# Patient Record
Sex: Female | Born: 1955 | Race: White | Hispanic: No | Smoking: Current every day smoker
Health system: Southern US, Community
[De-identification: ages and names within clinical notes are randomized; demographics above are authoritative.]

## PROBLEM LIST (undated history)

## (undated) DIAGNOSIS — I10 Essential (primary) hypertension: Secondary | ICD-10-CM

## (undated) DIAGNOSIS — J40 Bronchitis, not specified as acute or chronic: Secondary | ICD-10-CM

## (undated) DIAGNOSIS — E785 Hyperlipidemia, unspecified: Secondary | ICD-10-CM

## (undated) DIAGNOSIS — N3281 Overactive bladder: Secondary | ICD-10-CM

## (undated) DIAGNOSIS — K219 Gastro-esophageal reflux disease without esophagitis: Secondary | ICD-10-CM

## (undated) DIAGNOSIS — M069 Rheumatoid arthritis, unspecified: Secondary | ICD-10-CM

## (undated) DIAGNOSIS — K5090 Crohn's disease, unspecified, without complications: Secondary | ICD-10-CM

## (undated) DIAGNOSIS — F419 Anxiety disorder, unspecified: Secondary | ICD-10-CM

## (undated) HISTORY — PX: APPENDECTOMY: SHX54

## (undated) HISTORY — PX: VAGINAL HYSTERECTOMY: SUR661

## (undated) HISTORY — PX: CHOLECYSTECTOMY: SHX55

---

## 2001-11-21 ENCOUNTER — Inpatient Hospital Stay (HOSPITAL_COMMUNITY): Admission: AD | Admit: 2001-11-21 | Discharge: 2001-11-29 | Payer: Self-pay | Admitting: Gastroenterology

## 2001-11-21 ENCOUNTER — Encounter: Payer: Self-pay | Admitting: Gastroenterology

## 2001-11-22 ENCOUNTER — Encounter: Payer: Self-pay | Admitting: Gastroenterology

## 2001-11-26 ENCOUNTER — Encounter: Payer: Self-pay | Admitting: Gastroenterology

## 2001-12-08 ENCOUNTER — Inpatient Hospital Stay (HOSPITAL_COMMUNITY): Admission: EM | Admit: 2001-12-08 | Discharge: 2001-12-23 | Payer: Self-pay

## 2001-12-08 ENCOUNTER — Encounter: Payer: Self-pay | Admitting: Gastroenterology

## 2001-12-08 ENCOUNTER — Ambulatory Visit (HOSPITAL_COMMUNITY): Admission: RE | Admit: 2001-12-08 | Discharge: 2001-12-08 | Payer: Self-pay | Admitting: Gastroenterology

## 2001-12-09 ENCOUNTER — Encounter (INDEPENDENT_AMBULATORY_CARE_PROVIDER_SITE_OTHER): Payer: Self-pay | Admitting: *Deleted

## 2001-12-09 HISTORY — PX: OTHER SURGICAL HISTORY: SHX169

## 2001-12-16 ENCOUNTER — Encounter: Payer: Self-pay | Admitting: General Surgery

## 2002-03-06 ENCOUNTER — Inpatient Hospital Stay (HOSPITAL_COMMUNITY): Admission: AD | Admit: 2002-03-06 | Discharge: 2002-03-08 | Payer: Self-pay | Admitting: Gastroenterology

## 2002-03-06 ENCOUNTER — Encounter: Payer: Self-pay | Admitting: Gastroenterology

## 2002-03-22 ENCOUNTER — Ambulatory Visit (HOSPITAL_COMMUNITY): Admission: RE | Admit: 2002-03-22 | Discharge: 2002-03-22 | Payer: Self-pay | Admitting: Gastroenterology

## 2002-04-21 ENCOUNTER — Ambulatory Visit (HOSPITAL_COMMUNITY): Admission: RE | Admit: 2002-04-21 | Discharge: 2002-04-21 | Payer: Self-pay | Admitting: Gastroenterology

## 2002-06-21 ENCOUNTER — Encounter (HOSPITAL_COMMUNITY): Admission: RE | Admit: 2002-06-21 | Discharge: 2002-09-19 | Payer: Self-pay | Admitting: Gastroenterology

## 2002-10-11 ENCOUNTER — Encounter (HOSPITAL_COMMUNITY): Admission: RE | Admit: 2002-10-11 | Discharge: 2003-01-09 | Payer: Self-pay | Admitting: Gastroenterology

## 2003-01-17 ENCOUNTER — Encounter (HOSPITAL_COMMUNITY): Admission: RE | Admit: 2003-01-17 | Discharge: 2003-04-17 | Payer: Self-pay | Admitting: *Deleted

## 2003-04-27 ENCOUNTER — Encounter (HOSPITAL_COMMUNITY): Admission: RE | Admit: 2003-04-27 | Discharge: 2003-07-26 | Payer: Self-pay | Admitting: Gastroenterology

## 2003-09-12 ENCOUNTER — Encounter (HOSPITAL_COMMUNITY): Admission: RE | Admit: 2003-09-12 | Discharge: 2003-12-11 | Payer: Self-pay | Admitting: Gastroenterology

## 2004-01-21 ENCOUNTER — Encounter (HOSPITAL_COMMUNITY): Admission: RE | Admit: 2004-01-21 | Discharge: 2004-04-20 | Payer: Self-pay | Admitting: *Deleted

## 2004-05-28 ENCOUNTER — Encounter (HOSPITAL_COMMUNITY): Admission: RE | Admit: 2004-05-28 | Discharge: 2004-08-26 | Payer: Self-pay | Admitting: Gastroenterology

## 2004-08-27 ENCOUNTER — Encounter (HOSPITAL_COMMUNITY): Admission: RE | Admit: 2004-08-27 | Discharge: 2004-11-25 | Payer: Self-pay | Admitting: Gastroenterology

## 2004-12-30 ENCOUNTER — Encounter (HOSPITAL_COMMUNITY): Admission: RE | Admit: 2004-12-30 | Discharge: 2005-03-30 | Payer: Self-pay | Admitting: Gastroenterology

## 2005-05-13 ENCOUNTER — Encounter (HOSPITAL_COMMUNITY): Admission: RE | Admit: 2005-05-13 | Discharge: 2005-08-11 | Payer: Self-pay | Admitting: Gastroenterology

## 2005-09-21 ENCOUNTER — Encounter (HOSPITAL_COMMUNITY): Admission: RE | Admit: 2005-09-21 | Discharge: 2005-12-20 | Payer: Self-pay | Admitting: Gastroenterology

## 2006-01-25 ENCOUNTER — Encounter (HOSPITAL_COMMUNITY): Admission: RE | Admit: 2006-01-25 | Discharge: 2006-04-25 | Payer: Self-pay | Admitting: Gastroenterology

## 2006-06-02 ENCOUNTER — Encounter (HOSPITAL_COMMUNITY): Admission: RE | Admit: 2006-06-02 | Discharge: 2006-08-31 | Payer: Self-pay | Admitting: Gastroenterology

## 2006-10-04 ENCOUNTER — Encounter (HOSPITAL_COMMUNITY): Admission: RE | Admit: 2006-10-04 | Discharge: 2007-01-02 | Payer: Self-pay | Admitting: Gastroenterology

## 2007-02-07 ENCOUNTER — Encounter (HOSPITAL_COMMUNITY): Admission: RE | Admit: 2007-02-07 | Discharge: 2007-05-08 | Payer: Self-pay | Admitting: Gastroenterology

## 2007-05-17 ENCOUNTER — Encounter (HOSPITAL_COMMUNITY): Admission: RE | Admit: 2007-05-17 | Discharge: 2007-08-15 | Payer: Self-pay | Admitting: Gastroenterology

## 2007-09-05 ENCOUNTER — Encounter (HOSPITAL_COMMUNITY): Admission: RE | Admit: 2007-09-05 | Discharge: 2007-12-04 | Payer: Self-pay | Admitting: Gastroenterology

## 2007-12-12 ENCOUNTER — Encounter (HOSPITAL_COMMUNITY): Admission: RE | Admit: 2007-12-12 | Discharge: 2008-01-05 | Payer: Self-pay | Admitting: Gastroenterology

## 2008-02-06 ENCOUNTER — Encounter (HOSPITAL_COMMUNITY): Admission: RE | Admit: 2008-02-06 | Discharge: 2008-05-06 | Payer: Self-pay | Admitting: *Deleted

## 2008-05-29 ENCOUNTER — Encounter (HOSPITAL_COMMUNITY): Admission: RE | Admit: 2008-05-29 | Discharge: 2008-08-27 | Payer: Self-pay | Admitting: *Deleted

## 2008-06-29 ENCOUNTER — Encounter (INDEPENDENT_AMBULATORY_CARE_PROVIDER_SITE_OTHER): Payer: Self-pay | Admitting: General Surgery

## 2008-06-29 ENCOUNTER — Ambulatory Visit (HOSPITAL_BASED_OUTPATIENT_CLINIC_OR_DEPARTMENT_OTHER): Admission: RE | Admit: 2008-06-29 | Discharge: 2008-06-29 | Payer: Self-pay | Admitting: General Surgery

## 2008-09-14 ENCOUNTER — Encounter (HOSPITAL_COMMUNITY): Admission: RE | Admit: 2008-09-14 | Discharge: 2008-12-13 | Payer: Self-pay | Admitting: Gastroenterology

## 2008-11-12 ENCOUNTER — Encounter (HOSPITAL_COMMUNITY): Admission: RE | Admit: 2008-11-12 | Discharge: 2008-11-13 | Payer: Self-pay | Admitting: Gastroenterology

## 2009-01-08 ENCOUNTER — Encounter (HOSPITAL_COMMUNITY): Admission: RE | Admit: 2009-01-08 | Discharge: 2009-04-08 | Payer: Self-pay | Admitting: Gastroenterology

## 2009-04-26 ENCOUNTER — Encounter (HOSPITAL_COMMUNITY): Admission: RE | Admit: 2009-04-26 | Discharge: 2009-07-25 | Payer: Self-pay | Admitting: Gastroenterology

## 2009-08-23 ENCOUNTER — Encounter (HOSPITAL_COMMUNITY): Admission: RE | Admit: 2009-08-23 | Discharge: 2009-11-21 | Payer: Self-pay | Admitting: Gastroenterology

## 2009-12-04 ENCOUNTER — Encounter (HOSPITAL_COMMUNITY)
Admission: RE | Admit: 2009-12-04 | Discharge: 2010-02-04 | Payer: Self-pay | Source: Home / Self Care | Attending: Gastroenterology | Admitting: Gastroenterology

## 2010-03-27 ENCOUNTER — Encounter (HOSPITAL_COMMUNITY): Payer: Medicare Other | Attending: Gastroenterology

## 2010-03-27 DIAGNOSIS — K5090 Crohn's disease, unspecified, without complications: Secondary | ICD-10-CM | POA: Insufficient documentation

## 2010-04-14 LAB — POCT HEMOGLOBIN-HEMACUE: Hemoglobin: 15.2 g/dL — ABNORMAL HIGH (ref 12.0–15.0)

## 2010-04-24 ENCOUNTER — Encounter (HOSPITAL_COMMUNITY): Payer: Self-pay

## 2010-05-20 NOTE — Op Note (Signed)
NAMEJARIAH, TARKOWSKI             ACCOUNT NO.:  [id number]   MEDICAL RECORD NO.:  [id number]          PATIENT TYPE:  AMB   LOCATION:  DSC                          FACILITY:  [facility]   PHYSICIAN:  Gabrielle Dare. Janee Morn, M.D.DATE OF BIRTH:  [birth year]   DATE OF PROCEDURE:  06/29/2008  DATE OF DISCHARGE:                               OPERATIVE REPORT   PREOPERATIVE DIAGNOSIS:  Ulcerated abdominal wall scar.   POSTOPERATIVE DIAGNOSIS:  Ulcerated abdominal wall scar.   PROCEDURE:  Excision of ulcerated scar on the abdominal wall.   SURGEON:  Gabrielle Dare. Janee Morn, MD   ANESTHESIA:  General with laryngeal mask airway   HISTORY OF PRESENT ILLNESS:  Ms. Miyazaki is a 55 year old white female  who is well-known to me status post ileocolectomy with drainage of intra-  abdominal abscess for Crohn disease in 2003.  She has developed an ulcer  of the central portion of her wound.  This did not respond to local  treatment over a course of 2 weeks, so she now presents for excision of  this area.  We are going to excise the entirety of her scar including  the ulcerated region.   PROCEDURE IN DETAIL:  Informed consent was obtained and the patient was  identified in the preop holding area.  She received intravenous  antibiotics.  She was brought to the operating room.  General anesthesia  with laryngeal mask airway was administered by the anesthesia staff.  Her abdomen was prepped and draped in sterile fashion.  Time-out  procedure was done.  Next, 0.25% Marcaine with epinephrine was injected  along her scar for postoperative pain relief.  An elliptical incision  was made to encompass the entirety of her scar including the central  ulcerated portion.  Subcutaneous tissues were dissected down and the  scar was excised in its entirety.  The area beneath the ulceration did  not contain any retained stitch or any deep inflammatory tissue.  The  entirety of the scar was removed.  Further exploration of the  wound  revealed no other evidence of ongoing infection or stitch abscess.  No  hernias were felt.  The scar was marked for orientation and sent to  pathology.  The wound was copiously irrigated.  Meticulous hemostasis  was ensured.  Subcutaneous tissues were approximated with interrupted 3-  0 Vicryl sutures and the skin was closed with running 4-0 Monocryl  subcuticular stitch followed by Dermabond.  Sponge, needle, and  instrument counts were correct.  The patient tolerated the procedure  well without apparent complication and was taken to recovery room in  stable condition.      Gabrielle Dare Janee Morn, M.D.  Electronically Signed     BET/MEDQ  D:  06/29/2008  T:  06/29/2008  Job:  [id number]   cc:   Fayrene Fearing L. Malon Kindle., M.D.

## 2010-05-22 ENCOUNTER — Encounter (HOSPITAL_COMMUNITY): Payer: Medicare Other | Attending: Gastroenterology

## 2010-05-22 DIAGNOSIS — K5090 Crohn's disease, unspecified, without complications: Secondary | ICD-10-CM | POA: Insufficient documentation

## 2010-05-23 NOTE — Discharge Summary (Signed)
Jessica [name], Jessica [name]                         ACCOUNT NO.:  [id number]   MEDICAL RECORD NO.:  [id number]                   PATIENT TYPE:  INP   LOCATION:  5503                                 FACILITY:  [facility]   PHYSICIAN:  James L. Malon Kindle., M.D.          DATE OF BIRTH:  [birth year]   DATE OF ADMISSION:  12/08/2001  DATE OF DISCHARGE:  12/23/2001                                 DISCHARGE SUMMARY   ADMISSION DIAGNOSIS:  Increasing abdominal pain in patient with known  Crohn's disease.   FINAL DIAGNOSES:  1. Abdominal abscess.  2. Crohn's disease.  3. Status post cholecystectomy, appendectomy, and vaginal hysterectomy.   PROCEDURES:  Percutaneous drainage of abdominal abscess.   CONSULTATIONS:  Dr. Violeta Gelinas of general surgery.   HISTORY OF PRESENT ILLNESS:  The patient is a nice 55 year old woman with  known Crohn's disease.  She had three weeks of fever, chills, right lower  quadrant pain.  Colonoscopy done earlier this year showed active Crohn's  disease of the terminal ileum, but essentially normal colon.  She has had a  previous appendectomy.  She has had increasing abdominal pain, fever,  chills, was seen in the office with this history.   PHYSICAL EXAMINATION:  VITAL SIGNS:  Lack of fever.  HEART:  Normal.  LUNGS:  Normal.  ABDOMEN:  Distended and soft with good bowel sounds.  Marked tenderness of  the right lower quadrant.  No guarding, rigidity, or rebound.  Stool was  brown, heme negative.   For more details, please see the dictated admission history and physical.   HOSPITAL COURSE:  The patient was admitted to the medical floor.  Had a CT  scan obtained which revealed an abscess of the terminal ileum persistent  with Crohn's.  The patient was started on IV antibiotics, was given Pentasa.  Underwent a percutaneous drain placement with  ________ withdrawal.  Labs  were otherwise unremarkable, other then some leukocyte esterase in the  urine.  Cultures of  the abscess revealed microaerophilic Streptococci, as  well as multiple species consistent with an enteric abscess.  The patient  was seen in consultation by Dr. Violeta Gelinas.  The patient was doing well  following the percutaneous drainage.  Diet was started back.  She had some  gas pain, but gradually her symptoms improved.  She was able to tolerated a  low residue diet.  We went ahead and added Entocort EC.  She has been  intolerant in the past to prednisone, we resumed her Pentasa.  After  discussion with Dr. Janee Morn, we felt that we could send her home with the  percutaneous drain.  Make arrangements for her to come in and have a repeat  scan which will be done the week following her discharge.   DISPOSITION:  The patient is discharged home with plans to come back in for  an outpatient scan.  She has received instructions and care  for her  percutaneous drain.   DISCHARGE MEDICATIONS:  1. Pentasa 250 mg four tablets q.i.d.  2. Entocort EC 3 mg three tablets q.d.  3. Paxil 20 mg q.h.s.  4. Xanax 0.5 mg p.r.n. t.i.d.  5. Protonix 40 mg q.a.m.  6. Bextra one tablet q.d.  7. Cipro 500 mg b.i.d.  8. Flagyl 500 mg t.i.d.  9. Vicodin or Percocet for pain.   DIET:  Remain on a low residue diet.   We will make further determinations after the outpatient CT scan.                                                James L. Malon Kindle., M.D.    Waldron Session  D:  12/22/2001  T:  12/24/2001  Job:  [id number]   cc:   Gabrielle Dare. Janee Morn, M.D.  [facility]  [address] [place], [place] [address]  Fax: [phone]   Marjory Lies, M.D.  [address]  [place]  [place] [address]  Fax: [phone]

## 2010-05-23 NOTE — Discharge Summary (Signed)
   NAMEMARSHELLE, BILGER                         ACCOUNT NO.:  [id number]   MEDICAL RECORD NO.:  [id number]                   PATIENT TYPE:  INP   LOCATION:  5710                                 FACILITY:  [facility]   PHYSICIAN:  James L. Malon Kindle., M.D.          DATE OF BIRTH:  24-Nov-1955   DATE OF ADMISSION:  03/06/2002  DATE OF DISCHARGE:  03/08/2002                                 DISCHARGE SUMMARY   ADMISSION DIAGNOSIS:  Phlegmonous Crohn's disease.   FINAL DIAGNOSIS:  Phlegmonous Crohn's disease.   BRIEF HISTORY:  The patient is a 55 year old with known Crohn's, had recent  hospitalization with intra-abdominal abscess, had been on Asacol and Medrol,  having increased right lower quadrant pain.  Outpatient CT showed  phlegmonous changes over the terminal ileum.  There was some question of air  or possibly early abscess.  Due to concern over this, he was placed in  hospital.  For details, please see dictated admission History and Physical.   HOSPITAL COURSE:  The patient was admitted to the medical floor and after  discussion with Dr. Janee Morn, would treat Crohn's aggressively, so we gave  her another dose of Remicade, continued her on Imuran.  She felt much better  within 24 hours of the first dose of Remicade.  Her abdomen was soft, less  tender, and she was clearly much improved.  She was treated empirically with  Cipro and Flagyl.   At this point in time, she is doing well on Entocort, Remicade, Imuran,  Cipro, and Flagyl.  It was felt that she could be safely discharged home to  follow up as an outpatient.   DISPOSITION:  The patient is discharged home.   DISCHARGE MEDICATIONS:  1. Asacol 2 tablet t.i.d.  2. Xanax 0.5 mg t.i.d.  3. Protonix 40 mg daily.  4. Paxil 1 tablet daily.  5. Imuran 50 mg daily.  6. Entocort 3 mg 3 tablets daily.  7. Flagyl 250 mg t.i.d.   DIET:  Low residue.    FOLLOW UP:  Will call on discharge to set up second Remicade treatment.  See  Dr. Randa Evens back in the office in 4 to 6 weeks.                                               James L. Malon Kindle., M.D.    Waldron Session  D:  04/08/2002  T:  04/10/2002  Job:  [id number]   cc:   Marjory Lies, M.D.  [address]  [place]  [place] [address]  Fax: [phone]   Gabrielle Dare. Janee Morn, M.D.  [facility]  [address] [place], [place] [address]  Fax: [phone]

## 2010-05-23 NOTE — H&P (Signed)
NAMEBRITANNY, [name]                         ACCOUNT NO.:  [id number]   MEDICAL RECORD NO.:  [id number]                   PATIENT TYPE:  INP   LOCATION:  5710                                 FACILITY:  [facility]   PHYSICIAN:  Jessica [name]., M.D.          DATE OF BIRTH:  [birth year]   DATE OF ADMISSION:  03/06/2002  DATE OF DISCHARGE:                                HISTORY & PHYSICAL   REASON FOR ADMISSION:  Worsened Crohn's disease with possible abscess.   HISTORY OF PRESENT ILLNESS:  A 55 year old woman who has known Crohn's  disease.  She had a recent hospitalization with an intra-abdominal abscess  and underwent an ileocolectomy by Jessica [name]. Jessica [name], M.D.  She was initially  treated with a CT guided percutaneous drainage, but ultimately required  surgical drainage.  She has done well since then.  Has been on Asacol,  Medrol, etc.  We saw her in the office and she was having some increased  right lower quadrant pain and arranged for an outpatient CT which was done  today showing phlegmonous change around the terminal ileum with some air,  possible early abscess.  There is possible extraluminal gas, but this is  very early.  She is admitted for bowel rest, IV antibiotics and further  treatment.   CURRENT MEDICATIONS:  Asacol 800 mg t.i.d., Medrol 2 mg q a.m., paroxetine  20 mg q.h.s., Xanax 0.5 t.i.d. and Protonix 40 daily.   ALLERGIES:  Penicillin and Codeine, prednisone and sulfa.   PAST MEDICAL HISTORY:  1. Crohn's disease, status post recent ileocolectomy with drainage of     abscess.  The patient has never been on antimetabolites or Rheumatex.  2. History of bronchitis and asthma.  3. Previous surgeries include cholecystectomy, appendectomy, vaginal     hysterectomy.   FAMILY HISTORY:  Mother has had heart disease and she had no cancer in the  family.  Negative for inflammatory bowel disease.   SOCIAL HISTORY:  She is married, has four children.  She is a heavy  smoker.  Denies drinking.   REVIEW OF SYSTEMS:  Remarkable primarily for the worsening of abdominal pain  but lack of diarrhea.   PHYSICAL EXAMINATION:  VITAL SIGNS:  Temperature 97.9, pulse 67, blood  pressure 116/65.  GENERAL:  Obese white female in no acute distress.  HEENT:  Eyes:  Clear, nonicteric.  Extraocular movements intact.  Throat is  normal.  NECK:  Supple, adenopathy.  LUNGS:  Clear.  HEART:  Regular rate and rhythm without murmurs or gallops.  ABDOMEN:  Soft, nondistended with very mild right lower quadrant tenderness.   ASSESSMENT:  Recurrent phlegmonous Crohn's disease with the anastomosis from  her previous surgery with possible early abscess formation.  We have  reviewed these films with Jessica [name]. Jessica [name], M.D. and Jessica [name], M.D.  and it is not clear if this is early abscess or simply phlegmonous Crohn's.  PLAN:  Will treat aggressively with bowel rest with clear liquids only.  Will continue the Asacol and go ahead and give her a dose of Remicade.  Will  start her empirically on Imuran and will start Entocort.  If things improve  in the next several days, we could send her home.  Will give her empiric  antibiotics and hope that we can avoid surgery.                                                Jessica [name]., M.D.    Jessica [name]  D:  03/06/2002  T:  03/06/2002  Job:  [id number]   cc:   Jessica [name], M.D.  [address]  [place]  [place] [address]  Fax: [phone]   Jessica [name]. Jessica [name], M.D.  [facility]  [address] [place], [place] [address]  Fax: [phone]

## 2010-05-23 NOTE — Consult Note (Signed)
Jessica [name], Jessica [name]                         ACCOUNT NO.:  [id number]   MEDICAL RECORD NO.:  [id number]                   PATIENT TYPE:  INP   LOCATION:  1833                                 FACILITY:  [facility]   PHYSICIAN:  Gabrielle Dare. Janee Morn, M.D.             DATE OF BIRTH:  Nov 27, 1955   DATE OF CONSULTATION:  DATE OF DISCHARGE:                                   CONSULTATION   REASON FOR CONSULTATION:  Crohn's disease with abscess.   HISTORY OF PRESENT ILLNESS:  The patient is a 55 year old white female who  is known to me from a recent admission to Dr. Randa Evens service for an  exacerbation of her Crohn's disease with an abscess at her terminal ileum.  This had been percutaneously drained successfully and the drains were  removed.  She presented today to the radiology department for followup CT  scan of her abdomen which shows some recurrence of an abscess at the  terminal ileum.  There is still some inflammation of her terminal ileum  although it is a little bit less than before.  By history, she does complain  of some continuing pain down in her lower abdomen.  She has also been having  frequent diarrhea since her past discharge.  She denies any significant  hematochezia, nausea or vomiting.  She was seen by Dr. Randa Evens in the  radiology department and he planned to admit her to the hospital for  intravenous antibiotics and he spoke with me in regards to surgical  intervention.   ALLERGIES:  1. PENICILLIN.  2. CODEINE.  3. PREDNISONE.  4. SULFA.  5. DILAUDID.   PAST MEDICAL HISTORY:  Crohn's disease, bronchitis and asthma.   PAST SURGICAL HISTORY:  Cholecystectomy, appendectomy and vaginal  hysterectomy.   FAMILY HISTORY:  Mother has coronary artery disease.  Her father had neck  cancer.  Her grandmother had colon cancer and one aunt in the family has  inflammatory bowel disease.   SOCIAL HISTORY:  She smokes one pack a day.  She does not drink alcohol.   REVIEW OF  SYSTEMS:  GENERAL:  Negative.  CARDIOVASCULAR:  She has no chest  pain.  GI:  Please  refer to history of present illness.  MUSCULOSKELETAL:  She is complaining of some pain in her knees that she has been told is some  arthritis associated with her Crohn's disease.  Review of systems is  otherwise negative.   PHYSICAL EXAMINATION:  VITAL SIGNS:  Temperature 99.4, blood pressure  104/58, respirations 20, heart rate is 80.  GENERAL:  She is awake, alert in no acute distress. She is mildly upset.  NECK:  Supple.  LUNGS:  Clear to auscultation  bilaterally.  HEART:  Regular rate and rhythm.  ABDOMEN:  Nondistended. She has active bowel sounds.  She has some mild  tenderness in her infraumbilical area and suprapubic area with no guarding  or rebound.  EXTREMITIES:  Warm with good distal pulses.   Her CT scan was reviewed with radiologist and demonstrates a recurrent  abscess at her terminal ileum.  There is no extravasation of contrast from  the ileum into the abscess cavity and there is still some residual  inflammation of the terminal ileum.   ASSESSMENT:  Crohn's disease with recurrent intra-abdominal abscess at the  terminal ileum.  In light of the patient failing one attempt with  percutaneous drainage having complete resolution, I spoke with Dr. Randa Evens  and I agree with this recommendation for surgical intervention at this time.  I recommend going ahead with admitting the patient to the hospital as Dr.  Randa Evens has planned and giving her some IV antibiotics.  We will plan to do  a bowel prep tonight and go ahead tomorrow morning with surgery to include  right colectomy and resection of the distal ileum with drainage of intra-  abdominal abscess.  I discussed this at length with the patient and her  husband and including the risks and benefits she has agreed to proceed.                                               Gabrielle Dare Janee Morn, M.D.    BET/MEDQ  D:  12/08/2001  T:   12/08/2001  Job:  [id number]

## 2010-05-23 NOTE — Discharge Summary (Signed)
Jessica [name], Jessica [name]                         ACCOUNT NO.:  [id number]   MEDICAL RECORD NO.:  [id number]                   PATIENT TYPE:  INP   LOCATION:  5503                                 FACILITY:  [facility]   PHYSICIAN:  Gabrielle Dare. Janee Morn, M.D.             DATE OF BIRTH:  January 05, 1956   DATE OF ADMISSION:  12/08/2001  DATE OF DISCHARGE:  12/23/2001                                 DISCHARGE SUMMARY   DISCHARGE DIAGNOSIS:  Crohn's disease with intra-abdominal abscess, status  post ileocolectomy.   HISTORY OF PRESENT ILLNESS:  The patient is a 55 year old white female who  was known to me from a recent admission from Dr. Fayrene Fearing L. Edwards for  exacerbation of her Crohn's disease with an intra-abdominal abscess.  This  was initially treated with a CT-guided percutaneous drainage, but she  presented for follow-up CT of the abdomen as an outpatient which  demonstrated some recurrence of an abscess at her terminal ileum and she is  admitted to the hospital.   HOSPITAL COURSE:  After admission, plans were discussed with Dr. Fayrene Fearing L.  Edwards and we both agreed that the patient would need to proceed with  surgery in order to definitively treat this problem.  On December 09, 2001,  after undergoing bowel prep, she underwent resection of her terminal ileum  and portion of her right colon with evacuation of intra-abdominal abscess.  She tolerated the procedure well.  Postoperatively, she remained  hemodynamically stable and afebrile.  On December 11, 2001, she developed  some drainage from her wound.  Staples were removed and the wound was opened  and cultured.  She began on wet-to-dry dressings for several days but her  cultures grew out methicillin-resistant Staphylococcus positive.  She was  treated with vancomycin intravenously.   She had a JP drain placed in surgery which was removed as its drainage  tapered off.  She was maintained on Cipro and Flagyl intravenously to treat  her  intra-abdominal abscess.   Her wound, once it cleaned up a little bit, the wound care team was  consulted and they placed a VAC on it which continued to work very well.  Otherwise, the patient had somewhat of a prolonged ileus but her bowel  functionally gradually returned.  She tolerated slow advancement of a diet  from clear to a low residual Crohn's diet.  She continued to remain afebrile  and hemodynamically stable.  She completed 14 days of Cipro and Flagyl IV  and nine days of Vancomycin IV.  Her hemoglobin had been low during her  admission.  She was asymptomatic and not tachycardia and was 7.3 on the day  of discharge.  Considering the added risk of immunosuppression and the other  risks of transfusion, discussed with the patient and she agreed not to be  transfused.  This is appropriate as she is asymptomatic and does not have  significant cardiac history.  Otherwise, home health arranged for home VAC  dressing changes and these were set up.  She is discharged home in stable  condition.   DIET:  Low residual Crohn's diet.   ACTIVITY:  No lifting.   DISCHARGE MEDICATIONS:  1. Percocet 5/325 mg 1-2 q.6h. p.r.n. pain.  2. Asacol 400 mg 2 of these p.o. b.i.d.  3.     Decadron 4 mg 1 q.d.  4. Zyvox 400 mg p.o. b.i.d. for one week.   FOLLOW UP:  Follow-up will be in one week with Dr. Janee Morn and in one month  with Dr. Fayrene Fearing L. Edwards.                                               Gabrielle Dare Janee Morn, M.D.    BET/MEDQ  D:  12/23/2001  T:  12/25/2001  Job:  [id number]

## 2010-05-23 NOTE — H&P (Signed)
Jessica [name], Jessica [name]                          ACCOUNT NO.:  [id number]   MEDICAL RECORD NO.:  [id number]                   PATIENT TYPE:  INP   LOCATION:  5529                                 FACILITY:  [facility]   PHYSICIAN:  James L. Malon Kindle., M.D.          DATE OF BIRTH:  07-30-1955   DATE OF ADMISSION:  11/21/2001  DATE OF DISCHARGE:                                HISTORY & PHYSICAL   REASON FOR ADMISSION:  Abdominal pain in a woman with Crohn's disease.   HISTORY:  55 year old, white female with know Crohn's disease.  She has done  fairly well until recently, had a colonoscopy performed in April of 2003  showing Crohn's of the terminal ileum.  She had been very intolerant of  Prednisone, had to be Medrol because of severe psychological reactions to  Prednisone.  The last time that she was seen in the office was May of this  year, she was feeling quite well with the Asacol; we had her due to come  back in several months.  She comes in today with an acute working visit.  She has had three weeks of worsening abdominal pain in the right lower  quadrant, it has gotten progressively worse.  She is having some loose  stools, but mostly constipated with difficulty passing her bowel movements.  Some distention.  She has had some fever and chills, no dysuria, no nausea  and no vomiting.  At times, she has been too sick to take her medicine and  the pain has gotten progressively worse, it nearly had her doubled over.  She does indicate that she has had previous appendectomy at the age of 31.  Due to her worsening abdominal pain, she was seen urgently today.   CURRENT MEDICATIONS:  1. Paxil 20 mg q. h.s.  2. Xanax p.r.n.  3. Asacol 16 mg t.i.d.   ALLERGIES:  She is allergic to penicillin, codeine, Prednisone and sulfur.   MEDICAL HISTORY:  1. Crohn's disease with most recent colonoscopy showing no Crohn's of the     colon currently although she has had essentially a blockage of her  colon     from the sigmoid in the past due to Crohn's.  She did have active ileitis     in April fo this year on colonoscopy.  2. She does have a history of bronchitis and asthma but does not take     anything for this.   PREVIOUS SURGERIES:  Include a cholecystectomy in 1996, an appendectomy in  1977 and a vaginal hysterectomy in the early 1980s.  She has had four  children.   FAMILY HISTORY:  Mother had a CABG.  There was head and neck cancer in her  father.  Grandmother had colon cancer.  Alcoholism in the family.  Negative  for inflammatory bowel disease.   SOCIAL HISTORY:  She is married, has four children, does not smoke or drink.  REVIEW OF SYSTEMS:  Remarkable for progressive pain and lack of diarrhea or  bleeding.   PHYSICAL EXAMINATION:  VITAL SIGNS:  Temperature 97.9, pulse 64, blood  pressure 130/70, weight 212, down approximately 12-14 pounds from the  previous visit.  GENERAL:  The patient is tearful, but no severe distress.  EYES:  Clear, non-icteric.  Extraocular movements intact, sclerae not  icteric.  NECK:  Supple, no lymphadenopathy.  THROAT:  Normal.  Mucous membranes appear moist.  LUNGS:  Clear.  HEART:  Regular rate and rhythm without murmurs or gallops.  ABDOMEN:  Distended, generally soft with bowel sounds present with marked  tenderness in the right lower quadrant.  No guarding, rigidity or rebound.  I carefully examined the patient's abdomen and I did not see clearly an  appendectomy scar though she does give the history of having had an  appendectomy, this was in 1970.  She does have a lot of stretch marks in  that area and I presume one of these could actually represent an  appendectomy scar but it is not entirely clear cut.  RECTAL:  The stool is brown, formed and heme negative.   ASSESSMENT:  Right lower quadrant pain of unclear cause.  Due to fever and  chills, I think we need to presume that this is infectious, it is a bit  bothersome that I  cannot clearly see an appendectomy scar although she does  clearly give a history of having had an appendectomy.  This could be Crohn's  but she has no diarrhea or blood in the stool, it possibly is an abscess or  something else entirely.   PLAN:  E will give IV pain medicines for now, obtain routine labs, culture  her urine.  Obtain a CT of the abdomen to help [place] look for appendicitis,  Crohn's abscess, etc.                                               Fayrene Fearing L. Malon Kindle., M.D.    Waldron Session  D:  11/21/2001  T:  11/21/2001  Job:  [id number]

## 2010-05-23 NOTE — Consult Note (Signed)
[name], [name]                         ACCOUNT NO.:  [id number]   MEDICAL RECORD NO.:  [id number]                   PATIENT TYPE:  INP   LOCATION:  5529                                 FACILITY:  [facility]   PHYSICIAN:  Gabrielle Dare. Janee Morn, M.D.             DATE OF BIRTH:  01-24-1955   DATE OF CONSULTATION:  11/21/2001  DATE OF DISCHARGE:                                   CONSULTATION   REASON FOR CONSULTATION:  Abdominal abscess.   HISTORY OF PRESENT ILLNESS:  The patient is a 55 year old white female with  a history of Crohn's disease, who has been followed by Dr. Fayrene Fearing L. Edwards.  She had been feeling well until recently, when she has had three weeks of  increasing abdominal pain in the right lower quadrant.  It has gotten  progressively worse.  She has been having loose stools but no hematochezia.  She has had some abdominal distention with subjective fevers at home but she  has had no nausea or vomiting.  She was seen in Dr. Dahlia Client office and  admitted to the hospital today.   MEDICATIONS:  Medications at this time include Xanax, Benadryl, Cipro,  Flagyl and Percocet.   ALLERGIES:  Allergies include PENICILLIN, CODEINE, PREDNISONE, SULFA and  DILAUDID.   PAST MEDICAL HISTORY:  Crohn's disease and bronchitis and asthma.   PAST SURGICAL HISTORY:  Past surgical history includes cholecystectomy,  appendectomy, vaginal hysterectomy.   FAMILY HISTORY:  Mother has coronary artery disease.  Her father had some  head and neck cancer.  Her grandmother had colon cancer.  One aunt in the  family has inflammatory bowel disease.   SOCIAL HISTORY:  She smokes one pack a day and does not drink.   REVIEW OF SYSTEMS:  GENERAL:  Negative.  CARDIOVASCULAR:  No chest pain,  coronary artery disease or shortness of breath.  GI:  Please refer to the  HPI.  The review of systems is otherwise negative.   PHYSICAL EXAMINATION:  VITAL SIGNS:  Temperature is 99.1, blood pressure  124/64,  her pulse is 66 and respirations are 14.  GENERAL:  She is awake, alert and in no acute distress.  NECK:  Her neck is supple.  CHEST:  Her chest is clear to auscultation bilaterally.  HEART:  Her heart is regular rate and rhythm.  ABDOMEN:  Her abdomen is soft but in her mid-lower abdomen, she is tender to  palpation without rebound or guarding.  She does have bowel sounds.  EXTREMITIES:  Her extremities are warm with good distal pulses.   LABORATORY AND ACCESSORY CLINICAL DATA:  Her laboratories are sodium 137,  potassium 3.5, chloride 94, CO2 30, BUN 6, creatinine 0.8; glucose is 101.  Her white blood cell count is 12,300, hemoglobin 11.7, hematocrit 36.6,  platelets 521,000.  Alkaline phosphatase 331, AST 23, ALT 48.   Her CAT scan demonstrates a thickened terminal ileum with an  associated  large abscess right along side the terminal ileum.   ASSESSMENT:  Crohn's exacerbation with intra-abdominal abscess associated  with her terminal ileum.   RECOMMENDATIONS:  Recommendations include IV antibiotics, as she is already  on, and a CT-guided drainage of this abscess, which I arranged with  radiology to be done tomorrow morning.  I will follow her closely with you,  certainly for surgical intervention.  I believe she will improve with CT-  guided drainage and these IV antibiotics.  I discussed the plan with the  patient and her family and they are in agreement.   Thank you for this consult.                                               Gabrielle Dare Janee Morn, M.D.    BET/MEDQ  D:  11/21/2001  T:  11/22/2001  Job:  [id number]

## 2010-05-23 NOTE — Consult Note (Signed)
Jessica [name]                         ACCOUNT NO.:  [id number]   MEDICAL RECORD NO.:  [id number]                   PATIENT TYPE:  INP   LOCATION:  5710                                 FACILITY:  [facility]   PHYSICIAN:  Gabrielle Dare. Janee Morn, M.D.             DATE OF BIRTH:  15-Sep-1955   DATE OF CONSULTATION:  03/06/2002  DATE OF DISCHARGE:                                   CONSULTATION   REASON FOR CONSULTATION:  Crohn's exacerbation.   HISTORY OF PRESENT ILLNESS:  The patient is a 55 year old white female with  a history of Crohn's disease who is status post an ileocolectomy on December 09, 2001 for recurrent Crohn's abscess involving her terminal ileum.  She is  followed also by Dr. Fayrene Fearing L. Edwards.  She complains of  two-week history  of right lower quadrant abdominal pain, somewhat crampy and dull in nature.  She is an evaluated as an outpatient with a CAT scan today which  demonstrated a phlegmon around her new terminal ileum and anastomosis area  with some questionable extraluminal gas bubble.  There is no abscess or  fluid in the area.  She was admitted to Dr. Fayrene Fearing L. Edwards' service.   Currently, the patient still complains of some pain down her right lower  quadrant.  She is a little bit upset about having to come into the hospital  and she claims she is not feeling that badly.  She is eager to get her  treatment started so she can work towards going home.  She denies any other  current GI symptoms.  She is having some alternating constipation and  diarrhea but she says this is as she expects it normally with her  inflammatory bowel disease in conjunction with irritable bowel syndrome.   PAST MEDICAL HISTORY:  Significant for Crohn's disease, bronchitis, and  asthma.   PAST SURGICAL HISTORY:  Includes cholecystectomy, appendectomy, vaginal  hysterectomy, and ileocolectomy.   MEDICATIONS:  1. Asacol 800 mg p.o. t.i.d.  2. Eproxindine 20 mg p.o. q.h.s.  3. Xanax  0.5 mg p.o. t.i.d.  4. Protonix 40 mg p.o. q.d.  5. Cipro 400 mg IV q.12h.  6. Flagyl 500 mg IV q.8.  7. Paxil 20 mg p.o. q.d.   ALLERGIES:  PENICILLIN, CODEINE, PREDNISONE, and SULFA.   FAMILY HISTORY:  Noncontributory.   REVIEW OF SYMPTOMS:  GENERAL:  In general, she is been feeling somewhat  poorly over the past two weeks with this abdominal pain.  PULMONARY:  No  shortness of breath.  CARDIOVASCULAR:  No chest pain or other complaints.  GASTROINTESTINAL:  Please see the history of present illness.  MUSCULOSKELETAL:  She complains of some arthritis pain in her knees but  claims that her swelling is decreased.   PHYSICAL EXAMINATION:  VITAL SIGNS:  Temperature is 97.9, heart rate 67,  respirations 16, blood pressure 116/65.  GENERAL:  She is awake, alert  in no acute distress.  HEENT:  Pupils are equal and reactive.  NECK:  Supple without adenopathy.  LUNGS:  Clear to auscultation bilaterally.  HEART:  Regular rate and rhythm.  ABDOMEN:  Soft.  She has some tenderness in her right lower quadrant with no  guarding or peritoneal signs.  The rest of her abdomen is nontender.  No  masses are palpable.  Her midline wound has healed.  EXTREMITIES:  Warm with no peripheral edema.   LABORATORY DATA:  White blood cell count of 8.7, hemoglobin 11.8, hematocrit  36, platelets 341,000.  Sodium 136, potassium 3.9, chloride 100, CO2 30, BUN  5, creatinine 0.7, glucose 103.  AST is 15, ALT is 60, alkaline phosphate is  64 with a bilirubin of 0.2.   CT scan of the abdomen shows a phlegmon around her new terminal ileum in her  anastomosis area with some questionable evidence of extrarenal gas; however,  there is no abscess or free fluid in the area.   IMPRESSION:  Crohn's exacerbation of the phlegmon of the new terminal ileum.   RECOMMENDATIONS:  Bowel rest, intravenous antibiotics, some Crohn  medications as per Dr. Fayrene Fearing L. Edwards, and hopefully with medical therapy  we can get this  inflammation to subside.  The above was discussed with Dr.  Fayrene Fearing L. Edwards and I will follow her closely with you.                                               Gabrielle Dare Janee Morn, M.D.    BET/MEDQ  D:  03/06/2002  T:  03/06/2002  Job:  [id number]

## 2010-05-23 NOTE — Op Note (Signed)
NAMEREMEDY, CORPORAN                         ACCOUNT NO.:  [id number]   MEDICAL RECORD NO.:  [id number]                   PATIENT TYPE:  INP   LOCATION:  5503                                 FACILITY:  [facility]   PHYSICIAN:  Gabrielle Dare. Janee Morn, M.D.             DATE OF BIRTH:  1955-02-15   DATE OF PROCEDURE:  12/09/2001  DATE OF DISCHARGE:                                 OPERATIVE REPORT   PREOPERATIVE DIAGNOSIS:  Crohn's disease with abscess of terminal ileum.   POSTOPERATIVE DIAGNOSIS:  Crohn's disease with abscess of terminal ileum.   OPERATION PERFORMED:  1. Resection of terminal ileum.  2. Partial right colectomy with evacuation of intra-abdominal abscess.   SURGEON:  Gabrielle Dare. Janee Morn, M.D.   ASSISTANT:  Jimmye Norman, M.D.   ANESTHESIA:  General anesthesia.   HISTORY OF PRESENT ILLNESS:  The patient is a 55 year old female who had a  recent hospitalization for an exacerbation of her Crohn's disease with an  abscess near her terminal ileum.  She underwent percutaneous drainage and  improved clinically, and was discharged home, but on follow up CT yesterday  she was found to have a recurrent abscess in the area and residual  inflammation of her terminal ileum.  So she is brought to the operating room  for resection of the affected area and drainage of the intra-abdominal  abscess.   DESCRIPTION OF OPERATION:  The patient was brought to the operating room.  She had been receiving IV antibiotics and general anesthesia was  administered.  Her abdomen was prepped and draped in a sterile fashion.  A  midline incision was made and 15 mm were dissected down and the fascia was  divided, and the peritoneal cavity was entered without difficulty.  Exploration of the abdomen revealed a mass of very inflamed terminal ileum  down in the right lower quadrant and pelvis.  The remainder of the small  bowel seem unaffected.  The terminal ileum was dissected out of the pelvis  and the  abscess cavity was entered.  There was some purulent material there,  which was evacuated.  Subsequently several adhesions were taken down from  surrounding small bowel and the bladder from this area allowing mobilization  of the terminal ileum.  The mesentery was foreshortened by this inflammatory  process.  Once this was accomplished the small bowel was run from the  terminal ileum and back all the way up to the ligament of Treitz.  There was  a segment that was involved in this inflammatory process.  It was about 4-5  cm in length.  The rest of the small bowel was not inflamed and the right  colon was not inflamed either.  So, we proceeded with the resection.   The small bowel was divided with the GIA-55 stapler up to 10 cm proximal the  very bad inflamed area.  We needed to divided there as the mesentery  approaching the inflammation was very foreshortened, and to insure good  margins.  Subsequently the right colon was further mobilized from its  retroperitoneal attachments and then it was divided with a GIA-75 stapler  proximal to the hepatic flexure.  Once this was accomplished the mesentery  of the proximal right colon, cecum and the terminal ileum was subsequently  clamped, divided and then the area ligated or suture ligated in a meticulous  fashion as the mesentery was quite thickened and foreshortened.  Once this  was accomplished and specimen was removed good hemostasis was insured at the  point of removal from the mesentery.  Subsequently the abdomen was copiously  irrigated including the area of the abscess cavity.  I then proceeded to do  a side-to-side anastomosis of the ileum with the distal right colon with a  GIA-55 stapler.  The staple line was checked and noted to be hemostatic, and  the enterotomy was closed with a TA-60 stapler.  Prior to performing the  anastomosis a 2-0 silk suture was placed to secure the ileum to the colon at  the crouch of the anastomosis.   Subsequently the rent in the mesentery near  the anastomosis was closed with a series of interrupted 2-0 silk sutures.  Once this was accomplished the anastomosis was checked and there was no  leaks in the area as it was widely patent, and the colon and ileum there  were viable.  Subsequently the abdomen was again copiously irrigated.  We  changed our gloves.  A 19 French round Blake drain was placed down into the  abscess cavity deep down towards the pelvis and the sigmoid colon was placed  over this area to prevent any knuckles of small bowel from being trapped  down in there.  The NG tube was checked and noted to be in good position.  Consequently the abdomen was closed with a running #1 PDS suture.  The  subcutaneous tissues were copiously irrigated and the skin was closed with  staples.   Sponge and needle counts, and instrument count were all correct.   The patient tolerated the procedure well without obvious complications.  A  sterile dressing was applied and the patient was taken to the recovery room  in stable condition.                                                 Gabrielle Dare Janee Morn, M.D.    BET/MEDQ  D:  12/09/2001  T:  12/10/2001  Job:  [id number]

## 2010-05-23 NOTE — H&P (Signed)
Jessica [name], Jessica [name]                         ACCOUNT NO.:  [id number]   MEDICAL RECORD NO.:  [id number]                   PATIENT TYPE:  EMS   LOCATION:  MINO                                 FACILITY:  [facility]   PHYSICIAN:  James L. Malon Kindle., M.D.          DATE OF BIRTH:  September 21, 1955   DATE OF ADMISSION:  12/08/2001  DATE OF DISCHARGE:                                HISTORY & PHYSICAL   REASON FOR ADMISSION:  Recurrent abscess in a woman with Crohn's disease.   HISTORY OF PRESENT ILLNESS:  The patient is a 55 year old with known Crohn's  disease who has been doing  fairly well. In April of  2003 she had a  colonoscopy showing minimal inflammation of the terminal ileum. She had been  on Asacol since then and had done well. She came in early November after  three weeks of abdominal pain. A workup showed an intra-abdominal abscess.  It was drained percutaneously with marked improvement, and the patient was  discharged on Cipro and Flagyl. She was also discharged on Pentasa and  budesonide.   The patient came back in today for a repeat CT scan. She is having  increasing pain in her legs and difficulties with moving her legs and knees.  Some abdominal pain  but unfortunately a CT scan showed the abscess had  recurred with 4 to 5 cm of abscess and active Crohn's disease.   CURRENT MEDICATIONS:  1. Cipro 500 b.i.d.  2. Flagyl 500 b.i.d.  3. Xanax p.r.n.  4. Paxil 20 mg q.h.s.  5. Pentasa 4 tablets q.i.d.  6. Budesonide 9 mg q.d.   ALLERGIES:  1. PENICILLIN.  2. CODEINE.  3. PREDNISONE.  4. SULFA.   PAST MEDICAL HISTORY:  1. History of Crohn's disease with colonoscopy showing Crohn's at the     terminal ileum. She has had active Crohn's of the sigmoid colon in the     past.  2. History of bronchitis and asthma.   PAST SURGICAL HISTORY:  1. Cholecystectomy.  2. Appendectomy.  3. Vaginal  hysterectomy.  4. She has had four childbirths without incident.   FAMILY HISTORY:   Her mother has had coronary disease and has had neck  cancer, colon cancer in the family. Alcoholism. No inflammatory bowel  disease.   SOCIAL HISTORY:  She is married. She has four children. She does not smoke  or drink.   PHYSICAL EXAMINATION:  VITAL SIGNS:  Not available. She was examined in  radiology.  GENERAL:  The patient was in no obvious distress, complaining of severe pain  in the legs. Note the knees are aching and hurting. She is in a wheelchair.  HEENT:  Eyes clear, sclerae anicteric.  NECK:  Supple, no lymphadenopathy.  LUNGS:  Clear.  HEART:  Regular rate and rhythm without murmurs, rubs, gallops.  ABDOMEN:  Nondistended and soft but still tender in the right lower  quadrant.  EXTREMITIES:  Legs with 2+ pitting edema and somewhat tender.   ASSESSMENT:  1. Crohn's disease with recurrent pelvic abscess. I think she will     ultimately need surgery.  2. Leg pain, unclear cause. Will check for deep venous thrombosis.   PLAN:  Will admit and start IV antibiotics with steroids. I will ask Dr.  Janee Morn to see her again. We will give Vicodin  for arthritic pain and will  obtain Doppler to rule out deep venous thrombosis.                                               James L. Malon Kindle., M.D.    Waldron Session  D:  12/08/2001  T:  12/08/2001  Job:  [id number]   cc:   Marjory Lies, M.D.  [address]  [place]  [place] [address]  Fax: [phone]   Gabrielle Dare. Janee Morn, M.D.  [facility]  [address] [place], [place] [address]  Fax: [phone]

## 2010-07-17 ENCOUNTER — Encounter (HOSPITAL_COMMUNITY): Payer: Medicare Other | Attending: Gastroenterology

## 2010-07-17 DIAGNOSIS — K5090 Crohn's disease, unspecified, without complications: Secondary | ICD-10-CM | POA: Insufficient documentation

## 2010-09-11 ENCOUNTER — Encounter (HOSPITAL_COMMUNITY): Payer: Medicare Other

## 2010-09-19 ENCOUNTER — Encounter (HOSPITAL_COMMUNITY)
Admission: RE | Admit: 2010-09-19 | Discharge: 2010-09-19 | Disposition: A | Discharge status: Home / Self Care | Payer: Medicare Other | Source: Ambulatory Visit | Attending: Gastroenterology | Admitting: Gastroenterology

## 2010-09-19 DIAGNOSIS — K5090 Crohn's disease, unspecified, without complications: Secondary | ICD-10-CM | POA: Insufficient documentation

## 2010-11-05 ENCOUNTER — Other Ambulatory Visit (HOSPITAL_COMMUNITY): Payer: Self-pay | Admitting: *Deleted

## 2010-11-14 ENCOUNTER — Encounter (HOSPITAL_COMMUNITY): Payer: Medicare Other

## 2010-11-17 ENCOUNTER — Encounter (HOSPITAL_COMMUNITY)
Admission: RE | Admit: 2010-11-17 | Discharge: 2010-11-17 | Disposition: A | Discharge status: Home / Self Care | Payer: Medicare Other | Source: Ambulatory Visit | Attending: Gastroenterology | Admitting: Gastroenterology

## 2010-11-17 DIAGNOSIS — K5090 Crohn's disease, unspecified, without complications: Secondary | ICD-10-CM | POA: Insufficient documentation

## 2010-11-17 MED ORDER — SODIUM CHLORIDE 0.9 % IV SOLN: 600.0000 mg | INTRAVENOUS | Status: DC

## 2010-11-17 MED ORDER — ACETAMINOPHEN 500 MG PO TABS: 1000.0000 mg | ORAL_TABLET | Freq: Once | ORAL | Status: DC

## 2010-11-17 MED ORDER — SODIUM CHLORIDE 0.9 % IV SOLN
600.0000 mg | Freq: Once | INTRAVENOUS | Status: AC
  Administered 2010-11-17: 600 mg via INTRAVENOUS
  Filled 2010-11-17: qty 60

## 2010-11-17 MED ORDER — LORATADINE 10 MG PO TABS: 10.0000 mg | ORAL_TABLET | Freq: Once | ORAL | Status: DC

## 2010-11-17 MED ORDER — LORATADINE 10 MG PO TABS: 10.0000 mg | ORAL_TABLET | ORAL | Status: DC

## 2010-11-17 MED ORDER — ACETAMINOPHEN 500 MG PO TABS
1000.0000 mg | ORAL_TABLET | ORAL | Status: DC
  Administered 2010-11-17: 1000 mg via ORAL

## 2010-11-17 MED ORDER — LORATADINE 10 MG PO TABS
10.0000 mg | ORAL_TABLET | Freq: Every day | ORAL | Status: DC
  Administered 2010-11-17: 10 mg via ORAL
  Filled 2010-11-17: qty 1

## 2010-11-17 MED ORDER — ACETAMINOPHEN 500 MG PO TABS
ORAL_TABLET | ORAL | Status: AC
  Administered 2010-11-17: 1000 mg via ORAL
  Filled 2010-11-17: qty 2

## 2011-01-08 ENCOUNTER — Other Ambulatory Visit (HOSPITAL_COMMUNITY): Payer: Self-pay | Admitting: *Deleted

## 2011-01-08 MED ORDER — LORATADINE 10 MG PO TABS: 10.0000 mg | ORAL_TABLET | Freq: Once | ORAL | Status: DC

## 2011-01-08 MED ORDER — ACETAMINOPHEN 500 MG PO TABS: 1000.0000 mg | ORAL_TABLET | Freq: Once | ORAL | Status: DC

## 2011-01-08 MED ORDER — SODIUM CHLORIDE 0.9 % IV SOLN: 600.0000 mg | Freq: Once | INTRAVENOUS | Status: DC

## 2011-01-09 ENCOUNTER — Encounter (HOSPITAL_COMMUNITY)
Admission: RE | Admit: 2011-01-09 | Discharge: 2011-01-09 | Disposition: A | Discharge status: Home / Self Care | Payer: Medicare Other | Source: Ambulatory Visit | Attending: Gastroenterology | Admitting: Gastroenterology

## 2011-01-09 DIAGNOSIS — K5090 Crohn's disease, unspecified, without complications: Secondary | ICD-10-CM | POA: Insufficient documentation

## 2011-01-09 MED ORDER — LORATADINE 10 MG PO TABS: 10.0000 mg | ORAL_TABLET | Freq: Once | ORAL | Status: DC

## 2011-01-09 MED ORDER — SODIUM CHLORIDE 0.9 % IV SOLN
600.0000 mg | INTRAVENOUS | Status: AC
  Administered 2011-01-09: 600 mg via INTRAVENOUS
  Filled 2011-01-09: qty 60

## 2011-01-09 MED ORDER — ACETAMINOPHEN 500 MG PO TABS
ORAL_TABLET | ORAL | Status: AC
  Filled 2011-01-09: qty 2

## 2011-01-09 MED ORDER — ACETAMINOPHEN 500 MG PO TABS
1000.0000 mg | ORAL_TABLET | Freq: Once | ORAL | Status: AC
  Administered 2011-01-09: 1000 mg via ORAL

## 2011-01-12 ENCOUNTER — Encounter (HOSPITAL_COMMUNITY): Payer: Medicare Other

## 2011-03-06 ENCOUNTER — Encounter (HOSPITAL_COMMUNITY)
Admission: RE | Admit: 2011-03-06 | Discharge: 2011-03-06 | Disposition: A | Discharge status: Home / Self Care | Payer: Medicare Other | Source: Ambulatory Visit | Attending: Gastroenterology | Admitting: Gastroenterology

## 2011-03-06 DIAGNOSIS — K5090 Crohn's disease, unspecified, without complications: Secondary | ICD-10-CM | POA: Insufficient documentation

## 2011-03-06 MED ORDER — ACETAMINOPHEN 500 MG PO TABS
1000.0000 mg | ORAL_TABLET | ORAL | Status: DC
  Administered 2011-03-06: 1000 mg via ORAL
  Filled 2011-03-06: qty 2

## 2011-03-06 MED ORDER — SODIUM CHLORIDE 0.9 % IV SOLN
600.0000 mg | INTRAVENOUS | Status: DC
  Administered 2011-03-06: 600 mg via INTRAVENOUS
  Filled 2011-03-06: qty 60

## 2011-04-27 ENCOUNTER — Other Ambulatory Visit (HOSPITAL_COMMUNITY): Payer: Self-pay | Admitting: *Deleted

## 2011-04-27 ENCOUNTER — Encounter (HOSPITAL_COMMUNITY)
Admission: RE | Admit: 2011-04-27 | Discharge: 2011-04-27 | Disposition: A | Discharge status: Home / Self Care | Payer: Medicare Other | Source: Ambulatory Visit | Attending: Gastroenterology | Admitting: Gastroenterology

## 2011-04-27 DIAGNOSIS — K5090 Crohn's disease, unspecified, without complications: Secondary | ICD-10-CM | POA: Insufficient documentation

## 2011-04-27 MED ORDER — SODIUM CHLORIDE 0.9 % IV SOLN
Freq: Once | INTRAVENOUS | Status: AC
  Administered 2011-04-27: 09:00:00 via INTRAVENOUS

## 2011-04-27 MED ORDER — SODIUM CHLORIDE 0.9 % IV SOLN
600.0000 mg | Freq: Once | INTRAVENOUS | Status: AC
  Administered 2011-04-27: 600 mg via INTRAVENOUS
  Filled 2011-04-27: qty 60

## 2011-04-27 MED ORDER — LORATADINE 10 MG PO TABS: 10.0000 mg | ORAL_TABLET | Freq: Once | ORAL | Status: DC

## 2011-04-27 MED ORDER — ACETAMINOPHEN 500 MG PO TABS
1000.0000 mg | ORAL_TABLET | Freq: Once | ORAL | Status: AC
  Administered 2011-04-27: 1000 mg via ORAL
  Filled 2011-04-27: qty 2

## 2011-05-01 ENCOUNTER — Encounter (HOSPITAL_COMMUNITY): Payer: Medicare Other

## 2011-06-16 ENCOUNTER — Encounter (HOSPITAL_COMMUNITY)
Admission: RE | Admit: 2011-06-16 | Discharge: 2011-06-16 | Disposition: A | Discharge status: Home / Self Care | Payer: Medicare Other | Source: Ambulatory Visit | Attending: Gastroenterology | Admitting: Gastroenterology

## 2011-06-16 ENCOUNTER — Encounter (HOSPITAL_COMMUNITY): Payer: Medicare Other

## 2011-06-16 ENCOUNTER — Other Ambulatory Visit (HOSPITAL_COMMUNITY): Payer: Self-pay | Admitting: *Deleted

## 2011-06-16 DIAGNOSIS — K5090 Crohn's disease, unspecified, without complications: Secondary | ICD-10-CM | POA: Insufficient documentation

## 2011-06-16 MED ORDER — ACETAMINOPHEN 500 MG PO TABS: 1000.0000 mg | ORAL_TABLET | ORAL | Status: DC

## 2011-06-16 MED ORDER — SODIUM CHLORIDE 0.9 % IV SOLN
INTRAVENOUS | Status: DC
  Administered 2011-06-16: 10:00:00 via INTRAVENOUS

## 2011-06-16 MED ORDER — LORATADINE 10 MG PO TABS: 10.0000 mg | ORAL_TABLET | ORAL | Status: DC

## 2011-06-16 MED ORDER — SODIUM CHLORIDE 0.9 % IV SOLN
600.0000 mg | INTRAVENOUS | Status: DC
  Administered 2011-06-16: 600 mg via INTRAVENOUS
  Filled 2011-06-16: qty 60

## 2011-06-22 ENCOUNTER — Encounter (HOSPITAL_COMMUNITY): Payer: Medicare Other

## 2011-08-10 ENCOUNTER — Other Ambulatory Visit (HOSPITAL_COMMUNITY): Payer: Self-pay | Admitting: *Deleted

## 2011-08-11 ENCOUNTER — Encounter (HOSPITAL_COMMUNITY): Payer: Medicare Other

## 2011-08-29 ENCOUNTER — Inpatient Hospital Stay (HOSPITAL_COMMUNITY)
Admission: EM | Admit: 2011-08-29 | Discharge: 2011-09-02 | DRG: 194 | Disposition: A | Discharge status: Home / Self Care | Payer: Medicare Other | Attending: Internal Medicine | Admitting: Internal Medicine

## 2011-08-29 ENCOUNTER — Emergency Department (HOSPITAL_COMMUNITY): Payer: Medicare Other

## 2011-08-29 ENCOUNTER — Encounter (HOSPITAL_COMMUNITY): Payer: Self-pay | Admitting: *Deleted

## 2011-08-29 DIAGNOSIS — E785 Hyperlipidemia, unspecified: Secondary | ICD-10-CM | POA: Diagnosis present

## 2011-08-29 DIAGNOSIS — Z87891 Personal history of nicotine dependence: Secondary | ICD-10-CM

## 2011-08-29 DIAGNOSIS — J209 Acute bronchitis, unspecified: Secondary | ICD-10-CM | POA: Diagnosis present

## 2011-08-29 DIAGNOSIS — J069 Acute upper respiratory infection, unspecified: Secondary | ICD-10-CM | POA: Diagnosis present

## 2011-08-29 DIAGNOSIS — F411 Generalized anxiety disorder: Secondary | ICD-10-CM | POA: Diagnosis present

## 2011-08-29 DIAGNOSIS — M069 Rheumatoid arthritis, unspecified: Secondary | ICD-10-CM | POA: Diagnosis present

## 2011-08-29 DIAGNOSIS — J329 Chronic sinusitis, unspecified: Secondary | ICD-10-CM | POA: Diagnosis present

## 2011-08-29 DIAGNOSIS — J40 Bronchitis, not specified as acute or chronic: Secondary | ICD-10-CM | POA: Diagnosis present

## 2011-08-29 DIAGNOSIS — K5090 Crohn's disease, unspecified, without complications: Secondary | ICD-10-CM | POA: Diagnosis present

## 2011-08-29 DIAGNOSIS — Z720 Tobacco use: Secondary | ICD-10-CM

## 2011-08-29 DIAGNOSIS — D649 Anemia, unspecified: Secondary | ICD-10-CM | POA: Diagnosis present

## 2011-08-29 DIAGNOSIS — D638 Anemia in other chronic diseases classified elsewhere: Secondary | ICD-10-CM | POA: Diagnosis present

## 2011-08-29 DIAGNOSIS — J189 Pneumonia, unspecified organism: Principal | ICD-10-CM | POA: Diagnosis present

## 2011-08-29 DIAGNOSIS — M7989 Other specified soft tissue disorders: Secondary | ICD-10-CM

## 2011-08-29 DIAGNOSIS — K219 Gastro-esophageal reflux disease without esophagitis: Secondary | ICD-10-CM | POA: Diagnosis present

## 2011-08-29 DIAGNOSIS — N318 Other neuromuscular dysfunction of bladder: Secondary | ICD-10-CM | POA: Diagnosis present

## 2011-08-29 DIAGNOSIS — E876 Hypokalemia: Secondary | ICD-10-CM | POA: Diagnosis present

## 2011-08-29 DIAGNOSIS — I10 Essential (primary) hypertension: Secondary | ICD-10-CM | POA: Diagnosis present

## 2011-08-29 HISTORY — DX: Anxiety disorder, unspecified: F41.9

## 2011-08-29 HISTORY — DX: Gastro-esophageal reflux disease without esophagitis: K21.9

## 2011-08-29 HISTORY — DX: Overactive bladder: N32.81

## 2011-08-29 HISTORY — DX: Crohn's disease, unspecified, without complications: K50.90

## 2011-08-29 HISTORY — DX: Essential (primary) hypertension: I10

## 2011-08-29 HISTORY — DX: Hyperlipidemia, unspecified: E78.5

## 2011-08-29 HISTORY — DX: Bronchitis, not specified as acute or chronic: J40

## 2011-08-29 HISTORY — DX: Rheumatoid arthritis, unspecified: M06.9

## 2011-08-29 LAB — URINALYSIS, ROUTINE W REFLEX MICROSCOPIC
Bilirubin Urine: NEGATIVE
Hgb urine dipstick: NEGATIVE
Ketones, ur: NEGATIVE mg/dL
Specific Gravity, Urine: 1.018 (ref 1.005–1.030)
Urobilinogen, UA: 0.2 mg/dL (ref 0.0–1.0)
pH: 6 (ref 5.0–8.0)

## 2011-08-29 LAB — POCT I-STAT, CHEM 8
Calcium, Ion: 1.15 mmol/L (ref 1.12–1.23)
Chloride: 92 mEq/L — ABNORMAL LOW (ref 96–112)
Glucose, Bld: 95 mg/dL (ref 70–99)
HCT: 37 % (ref 36.0–46.0)
TCO2: 35 mmol/L (ref 0–100)

## 2011-08-29 LAB — CBC WITH DIFFERENTIAL/PLATELET
Basophils Absolute: 0 10*3/uL (ref 0.0–0.1)
Basophils Relative: 0 % (ref 0–1)
Eosinophils Relative: 1 % (ref 0–5)
HCT: 36 % (ref 36.0–46.0)
Hemoglobin: 12.2 g/dL (ref 12.0–15.0)
MCH: 30.8 pg (ref 26.0–34.0)
MCHC: 33.9 g/dL (ref 30.0–36.0)
MCV: 90.9 fL (ref 78.0–100.0)
Monocytes Absolute: 0.3 10*3/uL (ref 0.1–1.0)
Monocytes Relative: 4 % (ref 3–12)
Neutro Abs: 5.9 10*3/uL (ref 1.7–7.7)
RDW: 13.2 % (ref 11.5–15.5)

## 2011-08-29 MED ORDER — POTASSIUM CHLORIDE 10 MEQ/100ML IV SOLN
10.0000 meq | Freq: Once | INTRAVENOUS | Status: AC
  Administered 2011-08-30: 10 meq via INTRAVENOUS
  Filled 2011-08-29: qty 100

## 2011-08-29 MED ORDER — DEXTROSE 5 % IV SOLN
500.0000 mg | Freq: Once | INTRAVENOUS | Status: AC
  Administered 2011-08-30: 500 mg via INTRAVENOUS
  Filled 2011-08-29: qty 500

## 2011-08-29 MED ORDER — IPRATROPIUM BROMIDE 0.02 % IN SOLN
0.5000 mg | Freq: Once | RESPIRATORY_TRACT | Status: AC
  Administered 2011-08-29: 0.5 mg via RESPIRATORY_TRACT
  Filled 2011-08-29: qty 2.5

## 2011-08-29 MED ORDER — POTASSIUM CHLORIDE CRYS ER 20 MEQ PO TBCR
40.0000 meq | EXTENDED_RELEASE_TABLET | Freq: Once | ORAL | Status: AC
  Administered 2011-08-29: 40 meq via ORAL
  Filled 2011-08-29: qty 2

## 2011-08-29 MED ORDER — DEXTROSE 5 % IV SOLN
1.0000 g | Freq: Once | INTRAVENOUS | Status: AC
  Administered 2011-08-29: 1 g via INTRAVENOUS
  Filled 2011-08-29: qty 10

## 2011-08-29 MED ORDER — SODIUM CHLORIDE 0.9 % IV SOLN
INTRAVENOUS | Status: DC
  Administered 2011-08-29: 20 mL/h via INTRAVENOUS

## 2011-08-29 MED ORDER — ALBUTEROL SULFATE (5 MG/ML) 0.5% IN NEBU
5.0000 mg | INHALATION_SOLUTION | Freq: Once | RESPIRATORY_TRACT | Status: AC
  Administered 2011-08-29: 5 mg via RESPIRATORY_TRACT
  Filled 2011-08-29: qty 1

## 2011-08-29 NOTE — ED Notes (Signed)
Unresolved illnesses: now has more congestion, inc. Bilateral leg swollen, redness to lower legs, feels like head in tunnel. Last Tuesday, went to [facility]

## 2011-08-29 NOTE — ED Notes (Signed)
Attempted to pull meds.  Pixisis in ED down.  Pharmacy notifed.

## 2011-08-29 NOTE — ED Provider Notes (Signed)
History     CSN: [id number]  Arrival date & time 08/29/11  1840   First MD Initiated Contact with Patient 08/29/11 2254      Chief Complaint  Patient presents with  . Leg Pain  . Nasal Congestion  . Weakness    (Consider location/radiation/quality/duration/timing/severity/associated sxs/prior treatment) HPI Comments: Jessica [name] is a 56 y.o. Female who is here for concern of bilateral ankle swelling, with redness. She missed her dose of Remicade earlier this month, because she has a fever. She feels that her rheumatoid arthritis is worsening because of that. She also has persistent nasal congestion, feeling like "my head is in a tunnel", cough, with sputum production, and wheezing. No nausea, vomiting, diarrhea, fever, chills, or back pain. She was treated with a course of antibiotics. 3 weeks ago, without relief. Her primary care Dr. is, not in [place]. She sees a GI Dr. from [facility]) for her Crohn's disease. She continues to smoke. She does not know of any aggravating or palliative factors.  Patient is a 56 y.o. female presenting with leg pain and weakness. The history is provided by the patient.  Leg Pain   Weakness  Additional symptoms include weakness and leg pain.    Past Medical History  Diagnosis Date  . Arthritis   . Crohn's disease     No past surgical history on file.  No family history on file.  History  Substance Use Topics  . Smoking status: Not on file  . Smokeless tobacco: Not on file  . Alcohol Use:     OB History    Grav Para Term Preterm Abortions TAB SAB Ect Mult Living                  Review of Systems  Neurological: Positive for weakness.  All other systems reviewed and are negative.    Allergies  Penicillins; Prednisone; Dilaudid; Sulfa drugs cross reactors; and Codeine  Home Medications   Current Outpatient Rx  Name Route Sig Dispense Refill  . ALBUTEROL SULFATE HFA 108 (90 BASE) MCG/ACT IN AERS Inhalation  Inhale 2 puffs into the lungs every 6 (six) hours as needed.    . ALPRAZOLAM 0.5 MG PO TABS Oral Take 0.5 mg by mouth 2 (two) times daily.    . AZATHIOPRINE 50 MG PO TABS Oral Take 50 mg by mouth daily.    Marland Kitchen CALCIUM 600 + D PO Oral Take 1 tablet by mouth 2 (two) times daily.    Marland Kitchen DOXYCYCLINE MONOHYDRATE 100 MG PO TABS Oral Take 100 mg by mouth 2 (two) times daily. Started 08-17-11 ending 08-29-11    . ESOMEPRAZOLE MAGNESIUM 40 MG PO CPDR Oral Take 40 mg by mouth daily before breakfast.    . FESOTERODINE FUMARATE ER 8 MG PO TB24 Oral Take 8 mg by mouth daily.    Marland Kitchen GLUCOSAMINE CHONDR 1500 COMPLX PO Oral Take 1 tablet by mouth 2 (two) times daily.    . GUAIFENESIN ER 600 MG PO TB12 Oral Take 1,200 mg by mouth 2 (two) times daily.    Marland Kitchen REMICADE IV Intravenous Inject 1 application into the vein every 6 (six) weeks.    Marland Kitchen LISINOPRIL 20 MG PO TABS Oral Take 20 mg by mouth at bedtime.    Marland Kitchen LOVASTATIN 20 MG PO TABS Oral Take 20 mg by mouth at bedtime.    Marland Kitchen MESALAMINE 400 MG PO CPDR Oral Take 800 mg by mouth 3 (three) times daily.    Marland Kitchen  FISH OIL 1000 MG PO CAPS Oral Take 1,000 mg by mouth 3 (three) times daily.    Marland Kitchen PAROXETINE HCL 40 MG PO TABS Oral Take 40 mg by mouth every morning.      BP 138/59  Pulse 62  Temp 99.7 F (37.6 C) (Oral)  Resp 16  SpO2 95%  Physical Exam  Nursing note and vitals reviewed. Constitutional: She is oriented to person, place, and time. She appears well-developed.       Obese  HENT:  Head: Normocephalic and atraumatic.  Right Ear: External ear normal.  Left Ear: External ear normal.  Nose: Nose normal.  Mouth/Throat: Oropharynx is clear and moist. No oropharyngeal exudate.  Eyes: Conjunctivae and EOM are normal. Pupils are equal, round, and reactive to light.  Neck: Normal range of motion and phonation normal. Neck supple.  Cardiovascular: Normal rate, regular rhythm and intact distal pulses.   Pulmonary/Chest: Effort normal. No respiratory distress. She has  wheezes. She has no rales. She exhibits no tenderness.       Scattered rhonchi  Abdominal: Soft. She exhibits no distension. There is no tenderness. There is no guarding.  Musculoskeletal: Normal range of motion.       Mild edema, bilateral anterior, ankles, with indistinct red line tenderness of the skin; affected area includes the distal 20% of the lower extremities, bilaterally. No significant pain on passive range of motion of the ankles, bilaterally. No proximal streaking. No distinct ankle joint effusion. No knee effusions.  Neurological: She is alert and oriented to person, place, and time. She has normal strength. She exhibits normal muscle tone.  Skin: Skin is warm and dry.  Psychiatric: She has a normal mood and affect. Her behavior is normal. Judgment and thought content normal.    ED Course  Procedures (including critical care time)  Emergency department treatment:  IV fluids, IV, Rocephin, IV, Zithromax, IV, and oral potassium, nebulizer, with albuterol and Atrovent.   Date: 08/29/2011  Rate: 64  Rhythm: normal sinus rhythm  QRS Axis: normal  Intervals: normal  ST/T Wave abnormalities: normal  Conduction Disutrbances:first-degree A-V block   Narrative Interpretation:   Old EKG Reviewed: none available   Labs Reviewed  POCT I-STAT, CHEM 8 - Abnormal; Notable for the following:    Potassium 2.5 (*)     Chloride 92 (*)     All other components within normal limits  URINALYSIS, ROUTINE W REFLEX MICROSCOPIC - Abnormal; Notable for the following:    APPearance HAZY (*)     All other components within normal limits  CBC WITH DIFFERENTIAL   Dg Chest 2 View  08/29/2011  *RADIOLOGY REPORT*  Clinical Data: Cough and congestion.  Fever.  CHEST - 2 VIEW  Comparison: None.  Findings: Streaky left base density is worrisome for left lower lobe pneumonia.  Plate-like subsegmental atelectasis.  No effusion or pneumothorax. Mild interstitial prominence noted throughout both lung  fields.  Normal heart size.  No hilar or mediastinal masses. Calcified tortuous aorta.  Levoconvex mid thoracic scoliosis.  IMPRESSION: Probable early left lower lobe pneumonia. Associated plate-like atelectasis.   Original Report Authenticated By: Elsie Stain, M.D.      1. Community acquired pneumonia   2. Hypokalemia   3. Leg swelling   4. Tobacco abuse       MDM  Respiratory infection likely sinusitis, plus, pneumonia. Doubt congestive heart failure. Lower leg. Swelling is nonspecific. With differential diagnosis including peripheral edema, rheumatoid arthritis, and DVT. I think DVT is  a low probability. Patient requires admission for management, and observation.        Flint Melter, MD 08/30/11 [phone]

## 2011-08-30 ENCOUNTER — Encounter (HOSPITAL_COMMUNITY): Payer: Self-pay | Admitting: Internal Medicine

## 2011-08-30 DIAGNOSIS — K5090 Crohn's disease, unspecified, without complications: Secondary | ICD-10-CM | POA: Diagnosis present

## 2011-08-30 DIAGNOSIS — I10 Essential (primary) hypertension: Secondary | ICD-10-CM | POA: Diagnosis present

## 2011-08-30 DIAGNOSIS — E876 Hypokalemia: Secondary | ICD-10-CM | POA: Diagnosis present

## 2011-08-30 DIAGNOSIS — J189 Pneumonia, unspecified organism: Secondary | ICD-10-CM | POA: Diagnosis present

## 2011-08-30 LAB — MAGNESIUM
Magnesium: 1.1 mg/dL — ABNORMAL LOW (ref 1.5–2.5)
Magnesium: 2.1 mg/dL (ref 1.5–2.5)

## 2011-08-30 LAB — CBC
Hemoglobin: 11.2 g/dL — ABNORMAL LOW (ref 12.0–15.0)
RBC: 3.7 MIL/uL — ABNORMAL LOW (ref 3.87–5.11)

## 2011-08-30 LAB — BASIC METABOLIC PANEL
BUN: 9 mg/dL (ref 6–23)
CO2: 36 mEq/L — ABNORMAL HIGH (ref 19–32)
Calcium: 9.3 mg/dL (ref 8.4–10.5)
Creatinine, Ser: 0.85 mg/dL (ref 0.50–1.10)
GFR calc Af Amer: 86 mL/min — ABNORMAL LOW (ref >90)

## 2011-08-30 LAB — COMPREHENSIVE METABOLIC PANEL
BUN: 11 mg/dL (ref 6–23)
CO2: 38 meq/L — ABNORMAL HIGH (ref 19–32)
Calcium: 8.9 mg/dL (ref 8.4–10.5)
Creatinine, Ser: 0.76 mg/dL (ref 0.50–1.10)
GFR calc Af Amer: >90 mL/min (ref >90)
GFR calc non Af Amer: >90 mL/min (ref >90)
Glucose, Bld: 98 mg/dL (ref 70–99)

## 2011-08-30 LAB — NA AND K (SODIUM & POTASSIUM), RAND UR
Potassium Urine: 8 mEq/L
Sodium, Ur: 107 mEq/L

## 2011-08-30 MED ORDER — POTASSIUM CHLORIDE CRYS ER 20 MEQ PO TBCR
40.0000 meq | EXTENDED_RELEASE_TABLET | ORAL | Status: AC
  Administered 2011-08-30 (×2): 40 meq via ORAL
  Filled 2011-08-30: qty 2

## 2011-08-30 MED ORDER — AZATHIOPRINE 50 MG PO TABS
50.0000 mg | ORAL_TABLET | Freq: Every day | ORAL | Status: DC
  Administered 2011-08-30 – 2011-09-02 (×4): 50 mg via ORAL
  Filled 2011-08-30 (×4): qty 1

## 2011-08-30 MED ORDER — LISINOPRIL 20 MG PO TABS
20.0000 mg | ORAL_TABLET | Freq: Every day | ORAL | Status: DC
  Administered 2011-08-30 – 2011-09-01 (×4): 20 mg via ORAL
  Filled 2011-08-30 (×5): qty 1

## 2011-08-30 MED ORDER — SODIUM CHLORIDE 0.9 % IV SOLN: 250.0000 mL | INTRAVENOUS | Status: DC | PRN

## 2011-08-30 MED ORDER — DEXTROSE 5 % IV SOLN
1.0000 g | Freq: Every day | INTRAVENOUS | Status: DC
  Administered 2011-08-30: 1 g via INTRAVENOUS
  Filled 2011-08-30 (×2): qty 10

## 2011-08-30 MED ORDER — POTASSIUM CHLORIDE CRYS ER 20 MEQ PO TBCR
40.0000 meq | EXTENDED_RELEASE_TABLET | ORAL | Status: AC
  Administered 2011-08-30 (×2): 40 meq via ORAL
  Filled 2011-08-30 (×2): qty 2

## 2011-08-30 MED ORDER — MESALAMINE 400 MG PO CPDR
800.0000 mg | DELAYED_RELEASE_CAPSULE | Freq: Three times a day (TID) | ORAL | Status: DC
  Administered 2011-08-30 – 2011-09-02 (×10): 800 mg via ORAL
  Filled 2011-08-30 (×13): qty 2

## 2011-08-30 MED ORDER — SODIUM CHLORIDE 0.9 % IJ SOLN: 3.0000 mL | INTRAMUSCULAR | Status: DC | PRN

## 2011-08-30 MED ORDER — ALBUTEROL SULFATE HFA 108 (90 BASE) MCG/ACT IN AERS: 2.0000 | INHALATION_SPRAY | Freq: Four times a day (QID) | RESPIRATORY_TRACT | Status: DC | PRN

## 2011-08-30 MED ORDER — POTASSIUM CHLORIDE CRYS ER 20 MEQ PO TBCR
40.0000 meq | EXTENDED_RELEASE_TABLET | Freq: Once | ORAL | Status: AC
  Administered 2011-08-30: 40 meq via ORAL

## 2011-08-30 MED ORDER — PANTOPRAZOLE SODIUM 40 MG PO TBEC
40.0000 mg | DELAYED_RELEASE_TABLET | Freq: Every day | ORAL | Status: DC
  Administered 2011-08-30 – 2011-09-02 (×4): 40 mg via ORAL
  Filled 2011-08-30 (×3): qty 1
  Filled 2011-08-30: qty 2
  Filled 2011-08-30 (×2): qty 1

## 2011-08-30 MED ORDER — ACETAMINOPHEN 650 MG RE SUPP: 650.0000 mg | Freq: Four times a day (QID) | RECTAL | Status: DC | PRN

## 2011-08-30 MED ORDER — PAROXETINE HCL 20 MG PO TABS
40.0000 mg | ORAL_TABLET | Freq: Every day | ORAL | Status: DC
  Administered 2011-08-30 – 2011-09-02 (×4): 40 mg via ORAL
  Filled 2011-08-30 (×4): qty 2

## 2011-08-30 MED ORDER — ENOXAPARIN SODIUM 40 MG/0.4ML ~~LOC~~ SOLN
40.0000 mg | Freq: Every day | SUBCUTANEOUS | Status: DC
  Administered 2011-08-30 – 2011-09-02 (×4): 40 mg via SUBCUTANEOUS
  Filled 2011-08-30 (×4): qty 0.4

## 2011-08-30 MED ORDER — ACETAMINOPHEN 325 MG PO TABS
650.0000 mg | ORAL_TABLET | Freq: Four times a day (QID) | ORAL | Status: DC | PRN
  Administered 2011-08-30 – 2011-09-01 (×3): 650 mg via ORAL
  Filled 2011-08-30 (×3): qty 2

## 2011-08-30 MED ORDER — SODIUM CHLORIDE 0.9 % IJ SOLN
3.0000 mL | Freq: Two times a day (BID) | INTRAMUSCULAR | Status: DC
  Administered 2011-08-30 – 2011-09-01 (×2): 3 mL via INTRAVENOUS

## 2011-08-30 MED ORDER — FESOTERODINE FUMARATE ER 8 MG PO TB24
8.0000 mg | ORAL_TABLET | Freq: Every day | ORAL | Status: DC
  Administered 2011-08-30 – 2011-09-02 (×4): 8 mg via ORAL
  Filled 2011-08-30 (×9): qty 1

## 2011-08-30 MED ORDER — POTASSIUM CHLORIDE IN NACL 20-0.9 MEQ/L-% IV SOLN
INTRAVENOUS | Status: DC
  Administered 2011-08-30 – 2011-09-01 (×3): via INTRAVENOUS
  Filled 2011-08-30 (×8): qty 1000

## 2011-08-30 MED ORDER — POTASSIUM CHLORIDE CRYS ER 20 MEQ PO TBCR
40.0000 meq | EXTENDED_RELEASE_TABLET | Freq: Two times a day (BID) | ORAL | Status: DC
  Administered 2011-08-30: 40 meq via ORAL

## 2011-08-30 MED ORDER — ALPRAZOLAM 0.5 MG PO TABS
0.5000 mg | ORAL_TABLET | Freq: Two times a day (BID) | ORAL | Status: DC
  Administered 2011-08-30 – 2011-09-02 (×8): 0.5 mg via ORAL
  Filled 2011-08-30 (×8): qty 1

## 2011-08-30 MED ORDER — MAGNESIUM SULFATE 40 MG/ML IJ SOLN
2.0000 g | Freq: Once | INTRAMUSCULAR | Status: AC
  Administered 2011-08-30: 2 g via INTRAVENOUS
  Filled 2011-08-30: qty 50

## 2011-08-30 MED ORDER — SIMVASTATIN 10 MG PO TABS
10.0000 mg | ORAL_TABLET | Freq: Every day | ORAL | Status: DC
  Administered 2011-08-30 – 2011-09-01 (×3): 10 mg via ORAL
  Filled 2011-08-30 (×4): qty 1

## 2011-08-30 MED ORDER — DEXTROSE 5 % IV SOLN
500.0000 mg | Freq: Every day | INTRAVENOUS | Status: DC
  Administered 2011-08-30: 500 mg via INTRAVENOUS
  Filled 2011-08-30 (×3): qty 500

## 2011-08-30 NOTE — ED Notes (Signed)
Potassium decreased to 75 ml/hr and 0.9 ns increased to 200 ml/hr per pt pain.  Pt denies pain at iv site at this time.

## 2011-08-30 NOTE — ED Notes (Signed)
Exp wheezing diminished post breathing tx.  Pt states it is easier to breath.

## 2011-08-30 NOTE — ED Notes (Signed)
Dr Eyvonne Mechanic at bedside.

## 2011-08-30 NOTE — Progress Notes (Signed)
Subjective: Pt reports areas of redness b/l lower ext with warmth.  Otherwise denies ab pain, denies other complaints. Per pt she quit smoking and does not need a patch  Objective: Vital signs in last 24 hours: Filed Vitals:   08/30/11 0201 08/30/11 0517 08/30/11 0917 08/30/11 1400  BP: 166/71 181/75 117/66 142/73  Pulse: 72 73 67 84  Temp: 98.2 F (36.8 C) 97.6 F (36.4 C) 97.9 F (36.6 C) 98 F (36.7 C)  TempSrc: Oral Oral Oral Oral  Resp: 18 16 18 18   Height: 5\' 7"  (1.702 m)     Weight: 216 lb 8 oz (98.204 kg)     SpO2: 99% 91% 96% 97%   Weight change:   Intake/Output Summary (Last 24 hours) at 08/30/11 1643 Last data filed at 08/30/11 1500  Gross per 24 hour  Intake    900 ml  Output    600 ml  Net    300 ml   Vitals reviewed. General: resting in bed, NAD, obese, family at bedside HEENT: no scleral icterus, rhytides to face Cardiac: RRR, no rubs, murmurs or gallops Pulm: clear to auscultation bilaterally, no wheezes, rales, or rhonchi Abd: soft, nontender, nondistended, BS present (normal) Ext: b/l lower ext with redness, warmth; lines of demarcation on legs with marker to keep track of area involved Neuro: alert and oriented X3, CN 2-12 grossly intact Lab Results: Basic Metabolic Panel:  Lab 08/30/11 7829 08/30/11 0700 08/30/11 0600 08/29/11 1917  NA -- -- 144 141  K 2.8* -- 2.7* --  CL -- -- 98 92*  CO2 -- -- 38* --  GLUCOSE -- -- 98 95  BUN -- -- 11 8  CREATININE -- -- 0.76 1.10  CALCIUM -- -- 8.9 --  MG 2.1 1.1* -- --  PHOS -- -- -- --   Liver Function Tests:  Lab 08/30/11 0600  AST 13  ALT 12  ALKPHOS 86  BILITOT 0.2*  PROT 6.3  ALBUMIN 2.5*   CBC:  Lab 08/30/11 0600 08/29/11 1917 08/29/11 1910  WBC 7.8 -- 8.4  NEUTROABS -- -- 5.9  HGB 11.2* 12.6 --  HCT 33.4* 37.0 --  MCV 90.3 -- 90.9  PLT 344 -- 392   Urinalysis:  Lab 08/29/11 2114  COLORURINE YELLOW  LABSPEC 1.018  PHURINE 6.0  GLUCOSEU NEGATIVE  HGBUR NEGATIVE  BILIRUBINUR  NEGATIVE  KETONESUR NEGATIVE  PROTEINUR NEGATIVE  UROBILINOGEN 0.2  NITRITE NEGATIVE  LEUKOCYTESUR NEGATIVE   Misc. Labs: Urine K, Na  Studies/Results: Dg Chest 2 View  08/29/2011  *RADIOLOGY REPORT*  Clinical Data: Cough and congestion.  Fever.  CHEST - 2 VIEW  Comparison: None.  Findings: Streaky left base density is worrisome for left lower lobe pneumonia.  Plate-like subsegmental atelectasis.  No effusion or pneumothorax. Mild interstitial prominence noted throughout both lung fields.  Normal heart size.  No hilar or mediastinal masses. Calcified tortuous aorta.  Levoconvex mid thoracic scoliosis.  IMPRESSION: Probable early left lower lobe pneumonia. Associated plate-like atelectasis.   Original Report Authenticated By: Elsie Stain, M.D.    Medications:  Scheduled Meds:   . albuterol  5 mg Nebulization Once  . ALPRAZolam  0.5 mg Oral BID  . azaTHIOprine  50 mg Oral Daily  . azithromycin (ZITHROMAX) 500 MG IVPB  500 mg Intravenous Once  . azithromycin  500 mg Intravenous QHS  . cefTRIAXone (ROCEPHIN)  IV  1 g Intravenous Once  . cefTRIAXone (ROCEPHIN)  IV  1 g Intravenous QHS  .  enoxaparin (LOVENOX) injection  40 mg Subcutaneous Daily  . fesoterodine  8 mg Oral Daily  . ipratropium  0.5 mg Nebulization Once  . lisinopril  20 mg Oral QHS  . magnesium sulfate 1 - 4 g bolus IVPB  2 g Intravenous Once  . Mesalamine  800 mg Oral TID  . pantoprazole  40 mg Oral Daily  . PARoxetine  40 mg Oral Daily  . potassium chloride  10 mEq Intravenous Once  . potassium chloride SA  40 mEq Oral Once  . potassium chloride SA  40 mEq Oral Q4H  . potassium chloride  40 mEq Oral Once  . potassium chloride  40 mEq Oral Q4H  . simvastatin  10 mg Oral q1800  . sodium chloride  3 mL Intravenous Q12H  . DISCONTD: potassium chloride  40 mEq Oral BID   Continuous Infusions:   . DISCONTD: sodium chloride 20 mL/hr (08/29/11 2348)   PRN Meds:.sodium chloride, acetaminophen, acetaminophen,  albuterol, sodium chloride, DISCONTD: sodium chloride Assessment/Plan: 56 y.o PMH Rheumatoid arthritis, Crohns, HTN, HLD, anxiety, GERD, h/o bronchitis.  Patient presented 8/24 with worsening cough and reports of missing last remicade dose as well as b/l lower ext edema.   1.Community acquired pneumonia -previously tx'ed with Doxy -CXR with prob early left lower lobe pneumonia -Pt on AZM and Rocephin -will monitor VS   2. Hypokalemia -EKG wnl on admission -severely low this admission 2.7 -repleted with 210 meQ K -repeat bmet 1700, pending urine K  3. Hypomag -Mag was 1.1, repleted with Mag 2 g  4. Hypertension -BP 142/73  5. Overactive bladder  -cont Toviaz 8 mg qd  6. Crohn's disease (08/30/2011) -pt missed dose of remicade recently -asked for it this admission; will call Dr. Randa Evens in the am -holding d/t potential lung infection.    7. B/l legs with erythema, pain -will monitor -etiology could be skin manifestation of chrons -consider adding antibiotic for gm+ coverage skin  8. F/E/N -NS with K 20 meq 75 cc/hr  -will monitor and replace electrolytes prn -diet ordered   9. Dispo -home when stable   LOS: 1 day   Jessica [name] [phone] 08/30/2011, 4:43 PM

## 2011-08-30 NOTE — H&P (Signed)
IM Attending on-call  71 woman with Crohn's and RA, on remicade, azathioprine, and mesalamine.  Admitted now for URI with cough.  Reportedly had fever and dark phlegm.  Now has no fever and clear phlegm.  CXR with LLL atelectasis only.  K = 2.5 and Mg is 1.1.  This may be due to decreased PO, malabsorption (post ilio-colectomy) ar some consequence of one of her complex meds.  Would replete mag and check spot urine K.  If estimated daily urine K exceeds 15 meq, the loss is renal.  For now, would replete K and follow after discharge.

## 2011-08-30 NOTE — H&P (Signed)
Hospital Admission Note Date: 08/30/2011  Patient name: Jessica [name] Medical record number: [id number] Date of birth: [birth year] Age: 56 y.o. Gender: female PCP: No primary provider on file.  Medical Service:  Attending physician:     1st Contact: Dr. Shirlee Latch    Pager: [phone] 2nd Contact: Dr. Tonny Branch    Pager: [phone] After 5 pm or weekends: 1st Contact:      Pager: [phone] 2nd Contact:      Pager: [phone]  Chief Complaint:  Productive cough, hypokalemia, and redness and swelling of BLE  History of Present Illness: 56yo F with PMH of Crohn's disease, rheumatoid arthritis and HTN who presents to the ED with a 3 week h/o of productive cough with fevers that have not resolved after 12 day treatment of Doxycycline from her PCP. She states that at the end of July she began with a sore throat that persisted until Aug 6th when she developed a productive cough, sinus pressure, full ears, and fevers. She states that Aug 6th was the day she was to receive her next dose of Remicade from her Gastroenterologist, Dr. Randa Evens for her Crohn's disease, but she did not recveive the injection because she was not feeling well. She went tot her PCP and was given Doxycycline x 7days for pneumonia and a sinus infection. After finishing the abx, she did not feel any better, and on Aug 21st she went back to her PCP and was given 5 more days of Doxycycline, without improvement. The productive cough and fullness in her ears have persisted, and today she asked her children to bring her to [facility] from her home town of [place].  She also endorses erythema and pain at multiple sites on the anterior portion of her ankle that was 1st noticed on her left ankle on Tuesday and on her right ankle Sat. Am. She denied any trauma to the sites.  In the ED a CXR showed possible early LLL pneumonia with atelectasis, a UA was negative, and her white count was normal. She was found to be hypokalemic to 2.5 and was replaced in  the ED. She was also given a breathing treatment with Atrovent and started on Rocephin and Azithromycin for CAP.  Meds: Current Outpatient Rx   Name  Route  Sig  Dispense  Refill   .  ALBUTEROL SULFATE HFA 108 (90 BASE) MCG/ACT IN AERS  Inhalation  Inhale 2 puffs into the lungs every 6 (six) hours as needed.     .  ALPRAZOLAM 0.5 MG PO TABS  Oral  Take 0.5 mg by mouth 2 (two) times daily.     .  AZATHIOPRINE 50 MG PO TABS  Oral  Take 50 mg by mouth daily.     Marland Kitchen  CALCIUM 600 + D PO  Oral  Take 1 tablet by mouth 2 (two) times daily.     Marland Kitchen  DOXYCYCLINE MONOHYDRATE 100 MG PO TABS  Oral  Take 100 mg by mouth 2 (two) times daily. Started 08-17-11 ending 08-29-11     .  ESOMEPRAZOLE MAGNESIUM 40 MG PO CPDR  Oral  Take 40 mg by mouth daily before breakfast.     .  FESOTERODINE FUMARATE ER 8 MG PO TB24  Oral  Take 8 mg by mouth daily.     Marland Kitchen  GLUCOSAMINE CHONDR 1500 COMPLX PO  Oral  Take 1 tablet by mouth 2 (two) times daily.     .  GUAIFENESIN ER 600 MG PO TB12  Oral  Take 1,200 mg by mouth 2 (two) times daily.     Marland Kitchen  REMICADE IV  Intravenous  Inject 1 application into the vein every 6 (six) weeks.     Marland Kitchen  LISINOPRIL 20 MG PO TABS  Oral  Take 20 mg by mouth at bedtime.     Marland Kitchen  LOVASTATIN 20 MG PO TABS  Oral  Take 20 mg by mouth at bedtime.     Marland Kitchen  MESALAMINE 400 MG PO CPDR  Oral  Take 800 mg by mouth 3 (three) times daily.     Marland Kitchen  FISH OIL 1000 MG PO CAPS  Oral  Take 1,000 mg by mouth 3 (three) times daily.     Marland Kitchen  PAROXETINE HCL 40 MG PO TABS  Oral  Take 40 mg by mouth every morning.       Allergies: Allergies as of 08/29/2011 - Review Complete 08/29/2011  Allergen Reaction Noted  . Penicillins Anaphylaxis 11/17/2010  . Prednisone Anaphylaxis 11/17/2010  . Dilaudid (hydromorphone hcl)  11/17/2010  . Sulfa drugs cross reactors Other (See Comments) 11/17/2010  . Codeine Nausea And Vomiting and Rash 11/17/2010   Past Medical History  Diagnosis Date  . Rheumatoid arthritis   . Crohn's disease   .  Hypertension   . Overactive bladder   . Hyperlipidemia   . Anxiety    Past Surgical History  Procedure Date  . Ileocolectomy 12/09/2001    Dr. Janee Morn  . Cholecystectomy   . Appendectomy   . Vaginal hysterectomy    Family History  Problem Relation Age of Onset  . Heart disease Mother     died from pain med OD  . Cancer Paternal Grandmother     Colon cancer  . Heart disease Father   . Diabetes Maternal Grandmother    History   Social History  . Marital Status: Widowed    Spouse Name: N/A    Number of Children: N/A  . Years of Education: N/A   Occupational History  . Not on file.   Social History Main Topics  . Smoking status: Former Smoker -- 2.0 packs/day for 44 years    Types: Cigarettes    Quit date: 07/30/2011  . Smokeless tobacco: Not on file  . Alcohol Use: No  . Drug Use: No  . Sexually Active: Not on file   Other Topics Concern  . Not on file   Social History Narrative  . No narrative on file   Review of Systems: Constitutional: Endorses fever and fatigue.  HEENT: Endorses fullness in the ears, sinus pressure, sore throat, rhinorrhea.  Respiratory: Endorses SOB, cough. Denies chest tightness.   Cardiovascular: Endorses BLE edema. Denies chest pain.  Gastrointestinal: Denies nausea, vomiting, abdominal pain, diarrhea, constipation, blood in stool and abdominal distention.  Genitourinary: endorses overactive bladder. Denies hematuria, flank pain or difficulty urinating.  Musculoskeletal: Endorses joint pain and gait problems. Denies joint swelling.  Skin: Endorses erythematous and tender patches on BLE at the anterior ankles.  Neurological: Denies dizziness, seizures, syncope, weakness, light-headedness, numbness and headaches.  Hematological: Denies adenopathy. Easy bruising, personal or family bleeding history  Psychiatric/Behavioral: Denies suicidal ideation. Endorses some depression.  Physical Exam: Blood pressure 166/71, pulse 72, temperature  98.2 F (36.8 C), temperature source Oral, resp. rate 18, height 5\' 7"  (1.702 m), weight 216 lb 8 oz (98.204 kg), SpO2 99.00%.  Constitutional: Vital signs reviewed.  Patient is a well-developed and well-nourished female in no acute distress and cooperative with exam. Alert and  oriented x3.  Head: Normocephalic and atraumatic Eyes: Injected, conjunctiva, PERRL, EOMI, no scleral icterus.  Neck: Supple, Trachea midline normal ROM, No JVD, mass, thyromegaly, or carotid bruit present.  Cardiovascular: RRR, pulses symmetric and intact bilaterally. Non-pitting pedal edema Pulmonary/Chest: Productive cough with course breath sounds throughout. Abdominal: Obese, soft, non-tender, non-distended, bowel sounds are normal, no masses, organomegaly, or guarding present.  GU: No CVA tenderness Musculoskeletal: No joint deformities, ROM full and no non tender.  Neurological: A&O x3, Strength is normal and symmetric bilaterally, cranial nerve II-XII are grossly intact, no focal motor deficit, sensory intact to light touch bilaterally.  Skin: Warm, dry and intact. 2  Left sided and 1 right 1-2 cm diameter circumferential areas at the anterior ankles with erythema, warmth, and tenderness that are non fluctuant.  Psychiatric: Normal mood and affect. speech and behavior is normal. Judgment and thought content normal. Cognition and memory are normal.   Lab results: Basic Metabolic Panel:  [facility] 08/29/11 1917  NA 141  K 2.5*  CL 92*  CO2 35  GLUCOSE 95  BUN 8  CREATININE 1.10  CALCIUM --  MG --  PHOS --   CBC:  Basename 08/29/11 1917 08/29/11 1910  WBC -- 8.4  NEUTROABS -- 5.9  HGB 12.6 12.2  HCT 37.0 36.0  MCV -- 90.9  PLT -- 392     Urinalysis:  Basename 08/29/11 2114  COLORURINE YELLOW  LABSPEC 1.018  PHURINE 6.0  GLUCOSEU NEGATIVE  HGBUR NEGATIVE  BILIRUBINUR NEGATIVE  KETONESUR NEGATIVE  PROTEINUR NEGATIVE  UROBILINOGEN 0.2  NITRITE NEGATIVE  LEUKOCYTESUR NEGATIVE     Imaging results:  Dg Chest 2 View  08/29/2011  *RADIOLOGY REPORT*  Clinical Data: Cough and congestion.  Fever.  CHEST - 2 VIEW  Comparison: None.  Findings: Streaky left base density is worrisome for left lower lobe pneumonia.  Plate-like subsegmental atelectasis.  No effusion or pneumothorax. Mild interstitial prominence noted throughout both lung fields.  Normal heart size.  No hilar or mediastinal masses. Calcified tortuous aorta.  Levoconvex mid thoracic scoliosis.  IMPRESSION: Probable early left lower lobe pneumonia. Associated plate-like atelectasis.   Original Report Authenticated By: Elsie Stain, M.D.     Other results: EKG:  Normal sinus rhythm Cannot rule out Anterior infarct , age undetermined Abnormal ECG   Assessment & Plan by Problem: Active Problems:  Community acquired pneumonia  Hypokalemia  Hypertension  Crohn's disease  56yo F with PMH of HTN, Crohn's disease, and RA who was admitted for CAP and hypokalemia.  1. CAP: She was treated for a total of 12 days without improvement of sx, she was never hospitalized, and given her CXR and significant smoking history,  this is likely community acquired pnemonia. Rocephin and Azithromycin were started in the ED, and Atrovent was given. O2 sats in mid 90s on room air while talking.  - Continue Rocephin and Azithromycin for CAP coverage - Albuterol in nebulizer PRN for SOB and dyspnea due to cough - Albuterol HFA PRN  2. Hypokalemia: Her K was 2.5 on presentation to the ED, which is possibly due to poorer absorption 2/2 her ileocolectomy. She does not have diarrhea or vomiting. She has normal kidney function, so she should not be losing K there, and she is not on a diuretic. She was replaced with IV KCl and oral.  - oral KCl x2 - am BMP - If hypokalemia persists, check Mg level  3. HTN: She takes Lisinopril at home, which was  restarted in the hospital. Her BPs have been stable since admission. Will  continue to monitor. - Lisinopril 20mg  qhs.  4. Crohn's disease: She received Remicade injections from her GI physician, Dr. Randa Evens. She missed her last injection due to her PNA. Her Crohn's has been well controlled since '03 after she had an ileocolectomy and began Remicade. She also takes Imuran, which is likely benefiting her RA, and Asacol. - Continue Asacol and Imuran - Contact Dr. Randa Evens re: Remicade.  5. Rhuematoid arthritis: She endorses chronic pain esp in her knees. She refuses narcotic pain medication due to her family history of abuse. She is on Remicade for her Crohn's which also helps treat her RA along with the Imuran. - Continue Imuran - F/u on Remicade inj - Tylenol for pain  6. Overactive bladder: She states that since she has had her cough, the urgency has improved. She does take Toviaz at home for her symptoms, which was restarted here in the hospital.  7. 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 She currently takes Xanax and Paxil, which were restarted in the hospital.  8. HLD: She is on Lovastatin at home. We started Zocor on admission.  9. GERD: She takes omeprazole at home. Protonix was started on admission.  10. DVT PPx: Lovenox   Signed: Genelle Gather 08/30/2011, 2:22 AM

## 2011-08-30 NOTE — Progress Notes (Signed)
CRITICAL VALUE ALERT  Critical value received:Results for Jessica [name], Jessica [name] (MRN [id number]) as of 08/30/2011 07:02  Ref. Range 08/30/2011 06:00  Potassium Latest Range: 3.5-5.1 mEq/L 2.7 (LL)    Date of notification:  08/30/11 Time of notification:  0705  Critical value read back:  Nurse who received alert:  Carmin Muskrat   MD notified (1st page):  Dr Rito Ehrlich  Time of first page:  0708  MD notified (2nd page):  Time of second page:  Responding MD:  Dr Rito Ehrlich  Time MD responded:  [phone]

## 2011-08-31 ENCOUNTER — Encounter (HOSPITAL_COMMUNITY): Payer: Self-pay | Admitting: Radiology

## 2011-08-31 ENCOUNTER — Inpatient Hospital Stay (HOSPITAL_COMMUNITY): Payer: Medicare Other

## 2011-08-31 DIAGNOSIS — I10 Essential (primary) hypertension: Secondary | ICD-10-CM

## 2011-08-31 DIAGNOSIS — J40 Bronchitis, not specified as acute or chronic: Secondary | ICD-10-CM | POA: Diagnosis present

## 2011-08-31 DIAGNOSIS — J329 Chronic sinusitis, unspecified: Secondary | ICD-10-CM | POA: Diagnosis present

## 2011-08-31 DIAGNOSIS — J069 Acute upper respiratory infection, unspecified: Secondary | ICD-10-CM | POA: Diagnosis present

## 2011-08-31 LAB — BASIC METABOLIC PANEL
Calcium: 9.2 mg/dL (ref 8.4–10.5)
Creatinine, Ser: 0.64 mg/dL (ref 0.50–1.10)
GFR calc non Af Amer: >90 mL/min (ref >90)
Glucose, Bld: 102 mg/dL — ABNORMAL HIGH (ref 70–99)
Sodium: 142 mEq/L (ref 135–145)

## 2011-08-31 LAB — MRSA PCR SCREENING: MRSA by PCR: NEGATIVE

## 2011-08-31 LAB — LIPID PANEL
LDL Cholesterol: 62 mg/dL (ref 0–99)
Triglycerides: 108 mg/dL (ref <150)

## 2011-08-31 MED ORDER — IPRATROPIUM BROMIDE 0.02 % IN SOLN
0.5000 mg | Freq: Four times a day (QID) | RESPIRATORY_TRACT | Status: DC
  Administered 2011-08-31 – 2011-09-01 (×4): 0.5 mg via RESPIRATORY_TRACT
  Filled 2011-08-31 (×5): qty 2.5

## 2011-08-31 MED ORDER — MOXIFLOXACIN HCL 400 MG PO TABS
400.0000 mg | ORAL_TABLET | Freq: Every day | ORAL | Status: DC
  Administered 2011-08-31 – 2011-09-01 (×2): 400 mg via ORAL
  Filled 2011-08-31 (×3): qty 1

## 2011-08-31 MED ORDER — ALBUTEROL SULFATE (5 MG/ML) 0.5% IN NEBU
2.5000 mg | INHALATION_SOLUTION | Freq: Four times a day (QID) | RESPIRATORY_TRACT | Status: DC
  Administered 2011-08-31 – 2011-09-01 (×6): 2.5 mg via RESPIRATORY_TRACT
  Filled 2011-08-31 (×7): qty 0.5

## 2011-08-31 MED ORDER — AMLODIPINE BESYLATE 5 MG PO TABS
5.0000 mg | ORAL_TABLET | Freq: Every day | ORAL | Status: DC
  Administered 2011-08-31 – 2011-09-02 (×3): 5 mg via ORAL
  Filled 2011-08-31 (×3): qty 1

## 2011-08-31 MED ORDER — IPRATROPIUM BROMIDE HFA 17 MCG/ACT IN AERS
2.0000 | INHALATION_SPRAY | Freq: Four times a day (QID) | RESPIRATORY_TRACT | Status: DC
  Filled 2011-08-31: qty 12.9

## 2011-08-31 MED ORDER — DM-GUAIFENESIN ER 30-600 MG PO TB12
1.0000 | ORAL_TABLET | Freq: Two times a day (BID) | ORAL | Status: DC | PRN
  Administered 2011-08-31 – 2011-09-02 (×2): 1 via ORAL
  Filled 2011-08-31 (×2): qty 1

## 2011-08-31 NOTE — Progress Notes (Signed)
Internal Medicine Attending  Date: 08/31/2011  Patient name: Jessica [name] Medical record number: [id number] Date of birth: 26-Sep-1955 Age: 56 y.o. Gender: female  I saw and evaluated the patient on a.m. rounds with house staff.  See the note by resident Dr. Shirlee Latch for details of clinical findings and plans.  Patient reports continued productive cough.  Exam is notable for mild diffuse expiratory wheezing.  Chest x-ray today shows persistent left base atelectasis and airspace consolidation; maxillofacial CT scan shows moderate sinusitis diffusely; probable air-fluid levels in the frontal sinus bilaterally; no acute bony change.  Plans include change antibiotics to moxifloxacin which should cover both community-acquired pneumonia and bacterial sinusitis; add Atrovent to patient's inhaled bronchodilator regimen; discuss management of Crohn's and re-dosing of Remicade with patient's gastroenterologist Dr. Randa Evens; add amlodipine to antihypertensive regimen given elevated blood pressures.

## 2011-08-31 NOTE — Consult Note (Signed)
[facility] CON[facility] Reason for consult:Crohn's Disease Referring Physician: Internal medicine teaching service. Primary GI: Dr. Rosalita Levan is an 56 y.o. female.  HPI: Patient well known to me. Have been following her for Crohn's disease for approximately 20 years. She has had previous ileocolectomy with removal of her terminal ileum cecum. She's tolerated prednisone and other steroids very poorly but has responded dramatically to Remicade infusions. She's been virtually asymptomatic on regular Remicade infusions every 8 weeks. Her last infusion was due around the first week of August. This was put on hold because she had a fever. She went to her family physician and her in detail and he was given several courses of antibiotics. She apparently had sinusitis as well. She did not improve and came to the emergency room with productive cough and was found to have pneumonia and is currently being treated with Avelox. She's also developed some nodules in her lower legs the extensor surface. This is similar to erythema nodosum that she has had in the past with Crohn's but she has not had this now for 7 or 8 years while on Remicade. She also has been found to have low potassium but notes that she now is having much looser stools than before since her Remicade is late.  Past Medical History  Diagnosis Date  . Rheumatoid arthritis   . Crohn's disease   . Hypertension   . Overactive bladder   . Hyperlipidemia   . Anxiety   . GERD (gastroesophageal reflux disease)   . Bronchitis     Past Surgical History  Procedure Date  . Ileocolectomy 12/09/2001    Dr. Janee Morn  . Cholecystectomy   . Appendectomy   . Vaginal hysterectomy     Family History  Problem Relation Age of Onset  . Heart disease Mother     died from pain med OD  . Cancer Paternal Grandmother     Colon cancer  . Heart disease Father   . Diabetes Maternal Grandmother     Social History:  reports that she  quit smoking about 4 weeks ago. Her smoking use included Cigarettes. She has a 88 pack-year smoking history. She does not have any smokeless tobacco history on file. She reports that she does not drink alcohol or use illicit drugs.  Allergies:  Allergies  Allergen Reactions  . Penicillins Anaphylaxis  . Prednisone Anaphylaxis  . Dilaudid (Hydromorphone Hcl)     Causes bradycardia per pt  . Sulfa Drugs Cross Reactors Other (See Comments)    Lowers blood pressure  . Codeine Nausea And Vomiting and Rash    Medications;    . albuterol  2.5 mg Nebulization Q6H  . ALPRAZolam  0.5 mg Oral BID  . amLODipine  5 mg Oral Daily  . azaTHIOprine  50 mg Oral Daily  . enoxaparin (LOVENOX) injection  40 mg Subcutaneous Daily  . fesoterodine  8 mg Oral Daily  . ipratropium  0.5 mg Nebulization Q6H  . lisinopril  20 mg Oral QHS  . Mesalamine  800 mg Oral TID  . moxifloxacin  400 mg Oral Q2000  . pantoprazole  40 mg Oral Daily  . PARoxetine  40 mg Oral Daily  . potassium chloride  40 mEq Oral Q4H  . simvastatin  10 mg Oral q1800  . sodium chloride  3 mL Intravenous Q12H  . DISCONTD: azithromycin  500 mg Intravenous QHS  . DISCONTD: cefTRIAXone (ROCEPHIN)  IV  1 g Intravenous QHS  . DISCONTD:  ipratropium  2 puff Inhalation Q6H   PRN Meds acetaminophen, acetaminophen, albuterol, dextromethorphan-guaiFENesin, sodium chloride Results for orders placed during the hospital encounter of 08/29/11 (from the past 48 hour(s))  CBC WITH DIFFERENTIAL     Status: Normal   Collection Time   08/29/11  7:10 PM      Component Value Range Comment   WBC 8.4  4.0 - 10.5 K/uL    RBC 3.96  3.87 - 5.11 MIL/uL    Hemoglobin 12.2  12.0 - 15.0 g/dL    HCT 16.1  09.6 - 04.5 %    MCV 90.9  78.0 - 100.0 fL    MCH 30.8  26.0 - 34.0 pg    MCHC 33.9  30.0 - 36.0 g/dL    RDW 40.9  81.1 - 91.4 %    Platelets 392  150 - 400 K/uL    Neutrophils Relative 70  43 - 77 %    Neutro Abs 5.9  1.7 - 7.7 K/uL    Lymphocytes  Relative 25  12 - 46 %    Lymphs Abs 2.1  0.7 - 4.0 K/uL    Monocytes Relative 4  3 - 12 %    Monocytes Absolute 0.3  0.1 - 1.0 K/uL    Eosinophils Relative 1  0 - 5 %    Eosinophils Absolute 0.1  0.0 - 0.7 K/uL    Basophils Relative 0  0 - 1 %    Basophils Absolute 0.0  0.0 - 0.1 K/uL   POCT I-STAT, CHEM 8     Status: Abnormal   Collection Time   08/29/11  7:17 PM      Component Value Range Comment   Sodium 141  135 - 145 mEq/L    Potassium 2.5 (*) 3.5 - 5.1 mEq/L    Chloride 92 (*) 96 - 112 mEq/L    BUN 8  6 - 23 mg/dL    Creatinine, Ser 7.82  0.50 - 1.10 mg/dL    Glucose, Bld 95  70 - 99 mg/dL    Calcium, Ion 9.56  2.13 - 1.23 mmol/L    TCO2 35  0 - 100 mmol/L    Hemoglobin 12.6  12.0 - 15.0 g/dL    HCT 08.6  57.8 - 46.9 %    Comment NOTIFIED PHYSICIAN     URINALYSIS, ROUTINE W REFLEX MICROSCOPIC     Status: Abnormal   Collection Time   08/29/11  9:14 PM      Component Value Range Comment   Color, Urine YELLOW  YELLOW    APPearance HAZY (*) CLEAR    Specific Gravity, Urine 1.018  1.005 - 1.030    pH 6.0  5.0 - 8.0    Glucose, UA NEGATIVE  NEGATIVE mg/dL    Hgb urine dipstick NEGATIVE  NEGATIVE    Bilirubin Urine NEGATIVE  NEGATIVE    Ketones, ur NEGATIVE  NEGATIVE mg/dL    Protein, ur NEGATIVE  NEGATIVE mg/dL    Urobilinogen, UA 0.2  0.0 - 1.0 mg/dL    Nitrite NEGATIVE  NEGATIVE    Leukocytes, UA NEGATIVE  NEGATIVE MICROSCOPIC NOT DONE ON URINES WITH NEGATIVE PROTEIN, BLOOD, LEUKOCYTES, NITRITE, OR GLUCOSE <1000 mg/dL.  COMPREHENSIVE METABOLIC PANEL     Status: Abnormal   Collection Time   08/30/11  6:00 AM      Component Value Range Comment   Sodium 144  135 - 145 mEq/L    Potassium 2.7 (*) 3.5 - 5.1 mEq/L  Chloride 98  96 - 112 mEq/L    CO2 38 (*) 19 - 32 mEq/L    Glucose, Bld 98  70 - 99 mg/dL    BUN 11  6 - 23 mg/dL    Creatinine, Ser 1.61  0.50 - 1.10 mg/dL    Calcium 8.9  8.4 - 09.6 mg/dL    Total Protein 6.3  6.0 - 8.3 g/dL    Albumin 2.5 (*) 3.5 - 5.2  g/dL    AST 13  0 - 37 U/L    ALT 12  0 - 35 U/L    Alkaline Phosphatase 86  39 - 117 U/L    Total Bilirubin 0.2 (*) 0.3 - 1.2 mg/dL    GFR calc non Af Amer >90  >90 mL/min    GFR calc Af Amer >90  >90 mL/min   CBC     Status: Abnormal   Collection Time   08/30/11  6:00 AM      Component Value Range Comment   WBC 7.8  4.0 - 10.5 K/uL    RBC 3.70 (*) 3.87 - 5.11 MIL/uL    Hemoglobin 11.2 (*) 12.0 - 15.0 g/dL    HCT 04.5 (*) 40.9 - 46.0 %    MCV 90.3  78.0 - 100.0 fL    MCH 30.3  26.0 - 34.0 pg    MCHC 33.5  30.0 - 36.0 g/dL    RDW 81.1  91.4 - 78.2 %    Platelets 344  150 - 400 K/uL   MAGNESIUM     Status: Abnormal   Collection Time   08/30/11  7:00 AM      Component Value Range Comment   Magnesium 1.1 (*) 1.5 - 2.5 mg/dL   MAGNESIUM     Status: Normal   Collection Time   08/30/11 12:30 PM      Component Value Range Comment   Magnesium 2.1  1.5 - 2.5 mg/dL   POTASSIUM     Status: Abnormal   Collection Time   08/30/11 12:30 PM      Component Value Range Comment   Potassium 2.8 (*) 3.5 - 5.1 mEq/L   NA AND K (SODIUM & POTASSIUM), RAND UR     Status: Normal   Collection Time   08/30/11  6:03 PM      Component Value Range Comment   Sodium, Ur 107      Potassium Urine Timed 8     BASIC METABOLIC PANEL     Status: Abnormal   Collection Time   08/30/11 10:23 PM      Component Value Range Comment   Sodium 143  135 - 145 mEq/L    Potassium 3.7  3.5 - 5.1 mEq/L DELTA CHECK NOTED   Chloride 101  96 - 112 mEq/L    CO2 36 (*) 19 - 32 mEq/L    Glucose, Bld 88  70 - 99 mg/dL    BUN 9  6 - 23 mg/dL    Creatinine, Ser 9.56  0.50 - 1.10 mg/dL    Calcium 9.3  8.4 - 21.3 mg/dL    GFR calc non Af Amer 75 (*) >90 mL/min    GFR calc Af Amer 86 (*) >90 mL/min   BASIC METABOLIC PANEL     Status: Abnormal   Collection Time   08/31/11  5:39 AM      Component Value Range Comment   Sodium 142  135 - 145 mEq/L  Potassium 3.7  3.5 - 5.1 mEq/L    Chloride 103  96 - 112 mEq/L    CO2 32  19 -  32 mEq/L    Glucose, Bld 102 (*) 70 - 99 mg/dL    BUN 8  6 - 23 mg/dL    Creatinine, Ser 1.61  0.50 - 1.10 mg/dL    Calcium 9.2  8.4 - 09.6 mg/dL    GFR calc non Af Amer >90  >90 mL/min    GFR calc Af Amer >90  >90 mL/min   LIPID PANEL     Status: Normal   Collection Time   08/31/11  5:39 AM      Component Value Range Comment   Cholesterol 133  0 - 200 mg/dL    Triglycerides 045  <409 mg/dL    HDL 49  >81 mg/dL    Total CHOL/HDL Ratio 2.7      VLDL 22  0 - 40 mg/dL    LDL Cholesterol 62  0 - 99 mg/dL   MAGNESIUM     Status: Normal   Collection Time   08/31/11  5:39 AM      Component Value Range Comment   Magnesium 1.8  1.5 - 2.5 mg/dL   MRSA PCR SCREENING     Status: Normal   Collection Time   08/31/11 11:21 AM      Component Value Range Comment   MRSA by PCR NEGATIVE  NEGATIVE     Dg Chest 2 View  08/31/2011  *RADIOLOGY REPORT*  Clinical Data: Chronic cough.  CHEST - 2 VIEW  Comparison: 08/29/2011  Findings: Heart size is normal.  No pleural effusion or edema. There is atelectasis and consolidation is noted in the left base. This is similar to the previous examination.  IMPRESSION:  1.  Persistent left base atelectasis and airspace consolidation   Original Report Authenticated By: Rosealee Albee, M.D.    Dg Chest 2 View  08/29/2011  *RADIOLOGY REPORT*  Clinical Data: Cough and congestion.  Fever.  CHEST - 2 VIEW  Comparison: None.  Findings: Streaky left base density is worrisome for left lower lobe pneumonia.  Plate-like subsegmental atelectasis.  No effusion or pneumothorax. Mild interstitial prominence noted throughout both lung fields.  Normal heart size.  No hilar or mediastinal masses. Calcified tortuous aorta.  Levoconvex mid thoracic scoliosis.  IMPRESSION: Probable early left lower lobe pneumonia. Associated plate-like atelectasis.   Original Report Authenticated By: Elsie Stain, M.D.    Ct Maxillofacial Wo Cm  08/31/2011  *RADIOLOGY REPORT*  Clinical Data: Cough.   Stuffiness.  Rule out sinusitis.  CT MAXILLOFACIAL WITHOUT CONTRAST  Technique:  Multidetector CT imaging of the maxillofacial structures was performed. Multiplanar CT image reconstructions were also generated.  Comparison: None  Findings: Mild to moderate mucosal edema in the maxillary and ethmoid sinuses bilaterally.  Mucosal thickening also in the frontal sinuses.  Probable air-fluid levels in the frontal sinuses bilaterally.  Mucosal thickening is present in the sphenoid sinus bilaterally.  Mucosal edema/effusion in the mastoid sinus bilaterally with partial opacification of the mastoid tip bilaterally.  Mild soft tissue thickening in the middle ear on the right.  Left middle ear is clear.  No acute bony abnormality.  No mass or edema in the orbit.  IMPRESSION: Moderate sinusitis diffusely.  Probable air-fluid levels in the frontal sinus bilaterally.  No acute bony change.   Original Report Authenticated By: Camelia Phenes, M.D.  Blood pressure 154/86, pulse 70, temperature 98 F (36.7 C), temperature source Oral, resp. rate 18, height 5\' 7"  (1.702 m), weight 98.204 kg (216 lb 8 oz), SpO2 98.00%.  Physical exam:   Gen.-alert white female with a dry hacking cough Lungs-bilateral wheezing with some rhonchi on the left Heart-normal Abdomen-mild tenderness unchanged from previous exams Extremities-slight peripheral edema slight redness tender   Assessment: 1. Crohn's disease-patient has been in remission with chronic Remicade and is now approximately 2 weeks to 3 weeks past due. This could be causing erythema nodosum although her current presentation is not classic. I think she will be okay waiting another couple weeks and it would probably be best to adequately treat the pneumonia prior to resuming Remicade. 2. Left lower lobe pneumonia. This is been treated with Avelox and in view of chronic Remicade therapy we'll need to follow this very carefully  Plan: 1. Would treat the  pneumonia aggressively. I have told the patient to call next week after she is home and let me know how she is doing. If she is improving, will arrange outpatient Remicade which she receives here at  [facility]. We can also arrange a followup chest x-ray if needed.   Johnluke Haugen JR,Justin Meisenheimer L 08/31/2011, 5:28 PM

## 2011-08-31 NOTE — Progress Notes (Addendum)
Subjective: Pt c/o coughing yellow sputum with feeling like her head is stuffy but she denies nasal congestion.  Pt c/o decreased hearing.  Pt reports her ears are stuffy as well and she cleans them out with peroxide.  Pt reports warm, red, tender areas on b/l legs have come there previously.    Objective: Vital signs in last 24 hours: Filed Vitals:   08/31/11 0933 08/31/11 1000 08/31/11 1250 08/31/11 1600  BP:  154/86 167/80 154/86  Pulse:  72 69 70  Temp:  97.9 F (36.6 C) 97.9 F (36.6 C) 98 F (36.7 C)  TempSrc:  Oral Oral Oral  Resp:  18 18 18   Height:      Weight:      SpO2: 95% 94% 98% 98%   Weight change:   Intake/Output Summary (Last 24 hours) at 08/31/11 1718 Last data filed at 08/31/11 1100  Gross per 24 hour  Intake   1940 ml  Output      0 ml  Net   1940 ml   Vitals reviewed. General: resting in bed, NAD, obese HEENT: no scleral icterus, min frontal sinus ttp Cardiac: RRR, no rubs, murmurs or gallops Pulm: expiratory wheezes b/l Abd: soft, nontender, nondistended, BS present Ext: warm and well perfused, no pedal edema; erythema, ttp, warm lesions to b/l lower ext which are trending in from prior demarcated lines on legs  Neuro: alert and oriented X3 Lab Results: Basic Metabolic Panel:  Lab 08/31/11 4540 08/30/11 2223 08/30/11 1230  NA 142 143 --  K 3.7 3.7 --  CL 103 101 --  CO2 32 36* --  GLUCOSE 102* 88 --  BUN 8 9 --  CREATININE 0.64 0.85 --  CALCIUM 9.2 9.3 --  MG 1.8 -- 2.1  PHOS -- -- --   Liver Function Tests:  Lab 08/30/11 0600  AST 13  ALT 12  ALKPHOS 86  BILITOT 0.2*  PROT 6.3  ALBUMIN 2.5*   CBC:  Lab 08/30/11 0600 08/29/11 1917 08/29/11 1910  WBC 7.8 -- 8.4  NEUTROABS -- -- 5.9  HGB 11.2* 12.6 --  HCT 33.4* 37.0 --  MCV 90.3 -- 90.9  PLT 344 -- 392   Urinalysis:  Lab 08/29/11 2114  COLORURINE YELLOW  LABSPEC 1.018  PHURINE 6.0  GLUCOSEU NEGATIVE  HGBUR NEGATIVE  BILIRUBINUR NEGATIVE  KETONESUR NEGATIVE    PROTEINUR NEGATIVE  UROBILINOGEN 0.2  NITRITE NEGATIVE  LEUKOCYTESUR NEGATIVE   Misc. Labs: Urine K 8, urine Na 107  Studies/Results: Dg Chest 2 View  08/31/2011  *RADIOLOGY REPORT*  Clinical Data: Chronic cough.  CHEST - 2 VIEW  Comparison: 08/29/2011  Findings: Heart size is normal.  No pleural effusion or edema. There is atelectasis and consolidation is noted in the left base. This is similar to the previous examination.  IMPRESSION:  1.  Persistent left base atelectasis and airspace consolidation   Original Report Authenticated By: Rosealee Albee, M.D.    Dg Chest 2 View  08/29/2011  *RADIOLOGY REPORT*  Clinical Data: Cough and congestion.  Fever.  CHEST - 2 VIEW  Comparison: None.  Findings: Streaky left base density is worrisome for left lower lobe pneumonia.  Plate-like subsegmental atelectasis.  No effusion or pneumothorax. Mild interstitial prominence noted throughout both lung fields.  Normal heart size.  No hilar or mediastinal masses. Calcified tortuous aorta.  Levoconvex mid thoracic scoliosis.  IMPRESSION: Probable early left lower lobe pneumonia. Associated plate-like atelectasis.   Original Report Authenticated By: Elsie Stain,  M.D.    Ct Maxillofacial Wo Cm  08/31/2011  *RADIOLOGY REPORT*  Clinical Data: Cough.  Stuffiness.  Rule out sinusitis.  CT MAXILLOFACIAL WITHOUT CONTRAST  Technique:  Multidetector CT imaging of the maxillofacial structures was performed. Multiplanar CT image reconstructions were also generated.  Comparison: None  Findings: Mild to moderate mucosal edema in the maxillary and ethmoid sinuses bilaterally.  Mucosal thickening also in the frontal sinuses.  Probable air-fluid levels in the frontal sinuses bilaterally.  Mucosal thickening is present in the sphenoid sinus bilaterally.  Mucosal edema/effusion in the mastoid sinus bilaterally with partial opacification of the mastoid tip bilaterally.  Mild soft tissue thickening in the middle ear on the right.   Left middle ear is clear.  No acute bony abnormality.  No mass or edema in the orbit.  IMPRESSION: Moderate sinusitis diffusely.  Probable air-fluid levels in the frontal sinus bilaterally.  No acute bony change.   Original Report Authenticated By: Camelia Phenes, M.D.    Medications:  Scheduled Meds:    . albuterol  2.5 mg Nebulization Q6H  . ALPRAZolam  0.5 mg Oral BID  . amLODipine  5 mg Oral Daily  . azaTHIOprine  50 mg Oral Daily  . enoxaparin (LOVENOX) injection  40 mg Subcutaneous Daily  . fesoterodine  8 mg Oral Daily  . ipratropium  0.5 mg Nebulization Q6H  . lisinopril  20 mg Oral QHS  . Mesalamine  800 mg Oral TID  . moxifloxacin  400 mg Oral Q2000  . pantoprazole  40 mg Oral Daily  . PARoxetine  40 mg Oral Daily  . potassium chloride  40 mEq Oral Q4H  . simvastatin  10 mg Oral q1800  . sodium chloride  3 mL Intravenous Q12H  . DISCONTD: azithromycin  500 mg Intravenous QHS  . DISCONTD: cefTRIAXone (ROCEPHIN)  IV  1 g Intravenous QHS  . DISCONTD: ipratropium  2 puff Inhalation Q6H   Continuous Infusions:    . 0.9 % NaCl with KCl 20 mEq / L 75 mL/hr at 08/31/11 0844   PRN Meds:.acetaminophen, acetaminophen, albuterol, dextromethorphan-guaiFENesin, sodium chloride Assessment/Plan: 56 y.o PMH Rheumatoid arthritis, Crohns, HTN, HLD, anxiety, GERD, h/o bronchitis.  Patient presented 8/24 with worsening cough and reports of missing last remicade dose as well as b/l lower ext edema, redness, pain.   1.Community acquired pneumonia/URI/Acute bronchitis -previously tx'ed with Doxy for CAP -CXR with persistent left base atelectasis and airspace consolidation  -Pt on AZM and Rocephin transitioned to Avelox (will tx for longer duration if tolerating) -added Atrovent, Albuterol neb q6, Albuterol inhaler q 6 prn, Mucinex DM prn -ordered smoking cessation though pt states she quit prior to admission -consider outpt pfts to eval lungs since pt has been chronic smoker in the  past -will monitor VS   2. Moderate sinusitis -noted on CT maxillo facial scan today with diffuse air/fluid levels in frontal sinuses b/l  -pt on Avelox   3. Hypokalemia -EKG wnl on admission -severely low this admission 2.7 trended up to 3.7 after a lot of repletion -Urine K 8, Urine Na 107 -loss could be 2/2 chronic diarrhea 2/2 Crohns per Dr. Randa Evens  -pt was not taking HCTZ prior to admission  4. Hypomag -repleted this admission Mag 1.8 this am will trend  5. H/o Hypertension -BP 193/73 -Lisinopril 20 mg qd and added Norvasc 5 mg qd today  6. Overactive bladder  -cont Toviaz 8 mg qd  7. Crohn's disease (08/30/2011) -s/p ileocolectomy with ileum and cecum  resection -pt missed dose of remicade recently -asked for it this admission;  Spoke with Dr. Carman Ching today he rec holding Remicade for at least 2 weeks after resolution of acute infections  8. B/l legs with erythema, pain -will monitor -etiology could be skin manifestation of crohns vs cellulits -pt on AZM and rocephin with improvement; dc'ed these abx today now on Avelox -Dr. Randa Evens to see and comment on leg lesions today   9. F/E/N -NS with K 20 meq 75 cc/hr  -will monitor and replace electrolytes prn -diet ordered   10. Dispo -home when w/u complete   LOS: 2 days   [name] [phone] 08/31/2011, 5:18 PM

## 2011-09-01 LAB — MAGNESIUM: Magnesium: 1.7 mg/dL (ref 1.5–2.5)

## 2011-09-01 LAB — BASIC METABOLIC PANEL
CO2: 30 mEq/L (ref 19–32)
Calcium: 9.3 mg/dL (ref 8.4–10.5)
Creatinine, Ser: 0.65 mg/dL (ref 0.50–1.10)

## 2011-09-01 LAB — RETICULOCYTES
RBC.: 3.61 MIL/uL — ABNORMAL LOW (ref 3.87–5.11)
Retic Count, Absolute: 46.9 10*3/uL (ref 19.0–186.0)

## 2011-09-01 LAB — CBC WITH DIFFERENTIAL/PLATELET
Basophils Absolute: 0 10*3/uL (ref 0.0–0.1)
Eosinophils Absolute: 0.1 10*3/uL (ref 0.0–0.7)
Eosinophils Relative: 1 % (ref 0–5)
MCH: 30.3 pg (ref 26.0–34.0)
MCV: 91.5 fL (ref 78.0–100.0)
Platelets: 326 10*3/uL (ref 150–400)
RDW: 13.4 % (ref 11.5–15.5)
WBC: 7 10*3/uL (ref 4.0–10.5)

## 2011-09-01 LAB — IRON AND TIBC: UIBC: 207 ug/dL (ref 125–400)

## 2011-09-01 LAB — FERRITIN: Ferritin: 94 ng/mL (ref 10–291)

## 2011-09-01 MED ORDER — ALBUTEROL SULFATE (5 MG/ML) 0.5% IN NEBU
2.5000 mg | INHALATION_SOLUTION | Freq: Three times a day (TID) | RESPIRATORY_TRACT | Status: DC
  Administered 2011-09-01 – 2011-09-02 (×3): 2.5 mg via RESPIRATORY_TRACT
  Filled 2011-09-01 (×4): qty 0.5

## 2011-09-01 MED ORDER — PSEUDOEPHEDRINE HCL 30 MG PO TABS
30.0000 mg | ORAL_TABLET | ORAL | Status: DC | PRN
  Administered 2011-09-02 (×2): 30 mg via ORAL
  Filled 2011-09-01 (×2): qty 1

## 2011-09-01 MED ORDER — IPRATROPIUM BROMIDE 0.02 % IN SOLN
0.5000 mg | Freq: Three times a day (TID) | RESPIRATORY_TRACT | Status: DC
  Administered 2011-09-01 – 2011-09-02 (×3): 0.5 mg via RESPIRATORY_TRACT
  Filled 2011-09-01 (×4): qty 2.5

## 2011-09-01 NOTE — Progress Notes (Signed)
Internal Medicine Attending  Date: 09/01/2011  Patient name: Jessica [name] Medical record number: [id number] Date of birth: Oct 12, 1955 Age: 56 y.o. Gender: female  I saw and evaluated the patient and discussed her care with house staff. I reviewed the resident's note by Dr. Shirlee Latch and I agree with the resident's findings and plans as documented in her note.

## 2011-09-01 NOTE — Progress Notes (Signed)
Subjective: Pt states her head is still stuffy and her legs are still swollen. Otherwise she feels somewhat better today but doesn't want to go home until she feels better.   Objective: Vital signs in last 24 hours: Filed Vitals:   09/01/11 1000 09/01/11 1323 09/01/11 1457 09/01/11 1651  BP: 112/59 140/80  143/69  Pulse: 70 66  73  Temp: 98.2 F (36.8 C) 98 F (36.7 C)  98.5 F (36.9 C)  TempSrc: Oral     Resp: 20 19  17   Height:      Weight:      SpO2: 94% 99% 95% 92%   Weight change:   Intake/Output Summary (Last 24 hours) at 09/01/11 1800 Last data filed at 09/01/11 1300  Gross per 24 hour  Intake   1242 ml  Output    720 ml  Net    522 ml    General: resting in bed, NAD HEENT: no scleral icterus Cardiac: RRR, no rubs, murmurs or gallops Pulm: exp wheezes b/l  Abd: soft, nontender, nondistended, BS present (normal), obese Ext: warm and well perfused, no pedal edema; erythema improving to b/l lower ext b/l  Neuro: alert and oriented X3  Lab Results: Basic Metabolic Panel:  Lab 09/01/11 1610 08/31/11 0539  NA 144 142  K 3.6 3.7  CL 105 103  CO2 30 32  GLUCOSE 100* 102*  BUN 7 8  CREATININE 0.65 0.64  CALCIUM 9.3 9.2  MG 1.7 1.8  PHOS 3.7 --   Liver Function Tests:  Lab 08/30/11 0600  AST 13  ALT 12  ALKPHOS 86  BILITOT 0.2*  PROT 6.3  ALBUMIN 2.5*   CBC:  Lab 09/01/11 0650 08/30/11 0600 08/29/11 1910  WBC 7.0 7.8 --  NEUTROABS 5.1 -- 5.9  HGB 11.1* 11.2* --  HCT 33.5* 33.4* --  MCV 91.5 90.3 --  PLT 326 344 --   Urinalysis:  Lab 08/29/11 2114  COLORURINE YELLOW  LABSPEC 1.018  PHURINE 6.0  GLUCOSEU NEGATIVE  HGBUR NEGATIVE  BILIRUBINUR NEGATIVE  KETONESUR NEGATIVE  PROTEINUR NEGATIVE  UROBILINOGEN 0.2  NITRITE NEGATIVE  LEUKOCYTESUR NEGATIVE   Misc. Labs:none  Studies/Results: Dg Chest 2 View  08/31/2011  *RADIOLOGY REPORT*  Clinical Data: Chronic cough.  CHEST - 2 VIEW  Comparison: 08/29/2011  Findings: Heart size is normal.   No pleural effusion or edema. There is atelectasis and consolidation is noted in the left base. This is similar to the previous examination.  IMPRESSION:  1.  Persistent left base atelectasis and airspace consolidation   Original Report Authenticated By: Rosealee Albee, M.D.    Ct Maxillofacial Wo Cm  08/31/2011  *RADIOLOGY REPORT*  Clinical Data: Cough.  Stuffiness.  Rule out sinusitis.  CT MAXILLOFACIAL WITHOUT CONTRAST  Technique:  Multidetector CT imaging of the maxillofacial structures was performed. Multiplanar CT image reconstructions were also generated.  Comparison: None  Findings: Mild to moderate mucosal edema in the maxillary and ethmoid sinuses bilaterally.  Mucosal thickening also in the frontal sinuses.  Probable air-fluid levels in the frontal sinuses bilaterally.  Mucosal thickening is present in the sphenoid sinus bilaterally.  Mucosal edema/effusion in the mastoid sinus bilaterally with partial opacification of the mastoid tip bilaterally.  Mild soft tissue thickening in the middle ear on the right.  Left middle ear is clear.  No acute bony abnormality.  No mass or edema in the orbit.  IMPRESSION: Moderate sinusitis diffusely.  Probable air-fluid levels in the frontal sinus bilaterally.  No  acute bony change.   Original Report Authenticated By: Camelia Phenes, M.D.    Medications:  Scheduled Meds:    . albuterol  2.5 mg Nebulization TID  . ALPRAZolam  0.5 mg Oral BID  . amLODipine  5 mg Oral Daily  . azaTHIOprine  50 mg Oral Daily  . enoxaparin (LOVENOX) injection  40 mg Subcutaneous Daily  . fesoterodine  8 mg Oral Daily  . ipratropium  0.5 mg Nebulization TID  . lisinopril  20 mg Oral QHS  . Mesalamine  800 mg Oral TID  . moxifloxacin  400 mg Oral Q2000  . pantoprazole  40 mg Oral Daily  . PARoxetine  40 mg Oral Daily  . simvastatin  10 mg Oral q1800  . sodium chloride  3 mL Intravenous Q12H  . DISCONTD: albuterol  2.5 mg Nebulization Q6H  . DISCONTD: ipratropium  0.5  mg Nebulization Q6H   Continuous Infusions:    . 0.9 % NaCl with KCl 20 mEq / L 75 mL/hr at 09/01/11 1341   PRN Meds:.acetaminophen, acetaminophen, albuterol, dextromethorphan-guaiFENesin, pseudoephedrine, sodium chloride Assessment/Plan: 56 y.o PMH Rheumatoid arthritis, Crohns, HTN, HLD, anxiety, GERD, h/o bronchitis.  Patient presented 8/24 with worsening cough and reports of missing last remicade dose as well as b/l lower ext edema, redness, pain.   1.Community acquired pneumonia/URI/Acute bronchitis -previously tx'ed with Doxy for CAP -Continue Avelox, Atrovent, Albuterol neb q6, Albuterol inhaler q 6 prn, Mucinex DM prn -will need outpt pfts to eval lungs since pt has been chronic smoker in the past -will monitor VS -Dr. Randa Evens will repeat CXR at f/u  2. Moderate sinusitis -noted on CT maxillo facial scan today with diffuse air/fluid levels in frontal sinuses b/l  -Cont Avelox (started 8/26), added Sudafed today  3. Hypokalemia. resolved -loss could be 2/2 chronic diarrhea 2/2 Crohns per Dr. Randa Evens   4. Hypomag -monitoring electrolytes, Mag 1.7 today  5. H/o Hypertension -BP 141/67 -Lisinopril 20 mg qd, Norvasc 5 mg qd  6. Overactive bladder  -cont Toviaz 8 mg qd  7. Crohn's disease (08/30/2011) -s/p ileocolectomy with ileum and cecum resection -pt missed dose of remicade recently -will f/u with Dr. Randa Evens for Remicade for at least 2 weeks after resolution of acute infections with full course duration antibiotic treatment x 14 days  8. B/l legs with erythema, pain -improving -will monitor -etiology could be skin manifestation of crohns (i.e erythema nodosum per Dr. Randa Evens vs cellulits) -when pt able to get Remicade this will help plus she is on Abx currently  9. F/E/N -NS with K 20 meq 75 cc/hr  -will monitor and replace electrolytes prn -diet ordered   10. Dispo -home when w/u complete possibly 8/28   LOS: 3 days   [name] [phone] 09/01/2011,  6:00 PM

## 2011-09-02 DIAGNOSIS — D649 Anemia, unspecified: Secondary | ICD-10-CM | POA: Diagnosis present

## 2011-09-02 LAB — CBC WITH DIFFERENTIAL/PLATELET
Basophils Absolute: 0 10*3/uL (ref 0.0–0.1)
Basophils Relative: 0 % (ref 0–1)
Eosinophils Relative: 2 % (ref 0–5)
Lymphocytes Relative: 20 % (ref 12–46)
MCV: 93.9 fL (ref 78.0–100.0)
Neutro Abs: 4.3 10*3/uL (ref 1.7–7.7)
Platelets: 329 10*3/uL (ref 150–400)
RDW: 13.8 % (ref 11.5–15.5)
WBC: 6.1 10*3/uL (ref 4.0–10.5)

## 2011-09-02 LAB — BASIC METABOLIC PANEL
Calcium: 9.6 mg/dL (ref 8.4–10.5)
Creatinine, Ser: 0.65 mg/dL (ref 0.50–1.10)
GFR calc Af Amer: >90 mL/min (ref >90)

## 2011-09-02 LAB — MAGNESIUM: Magnesium: 1.9 mg/dL (ref 1.5–2.5)

## 2011-09-02 MED ORDER — OXYMETAZOLINE HCL 0.05 % NA SOLN: 1.0000 | Freq: Two times a day (BID) | NASAL | Status: DC

## 2011-09-02 MED ORDER — OXYMETAZOLINE HCL 0.05 % NA SOLN: 1.0000 | Freq: Two times a day (BID) | NASAL | Status: AC

## 2011-09-02 MED ORDER — IPRATROPIUM BROMIDE HFA 17 MCG/ACT IN AERS: 2.0000 | INHALATION_SPRAY | Freq: Four times a day (QID) | RESPIRATORY_TRACT | Status: DC

## 2011-09-02 MED ORDER — PSEUDOEPHEDRINE HCL 30 MG PO TABS: 30.0000 mg | ORAL_TABLET | ORAL | Status: AC | PRN

## 2011-09-02 MED ORDER — OXYMETAZOLINE HCL 0.05 % NA SOLN
1.0000 | Freq: Two times a day (BID) | NASAL | Status: DC
  Administered 2011-09-02: 1 via NASAL
  Filled 2011-09-02: qty 15

## 2011-09-02 MED ORDER — PSEUDOEPHEDRINE HCL 30 MG PO TABS: 30.0000 mg | ORAL_TABLET | ORAL | Status: DC | PRN

## 2011-09-02 MED ORDER — AMLODIPINE BESYLATE 5 MG PO TABS: 5.0000 mg | ORAL_TABLET | Freq: Every day | ORAL | Status: DC

## 2011-09-02 MED ORDER — MOXIFLOXACIN HCL 400 MG PO TABS: 400.0000 mg | ORAL_TABLET | Freq: Every day | ORAL | Status: AC

## 2011-09-02 NOTE — Progress Notes (Addendum)
Subjective: Pt still complains of head stuffiness she did not get the Sudafed dose last night. She also has not been using the as needed inhalers only the scheduled nebulizes.  She still reports leg pain and swelling. Patient reports she quit smoking at the end of July this year.  She reports she is breathing better but she is still coughing   Objective: Vital signs in last 24 hours: Filed Vitals:   09/01/11 2021 09/01/11 2105 09/02/11 0442 09/02/11 0916  BP: 129/66  136/69 113/75  Pulse: 74  60 75  Temp: 98.8 F (37.1 C)  97.9 F (36.6 C) 98.4 F (36.9 C)  TempSrc: Oral  Oral Oral  Resp: 18  18 18   Height: 5\' 7"  (1.702 m)     Weight: 220 lb 10.9 oz (100.1 kg)     SpO2: 100% 98% 98% 95%   Weight change: 0 lb (0 kg)  Intake/Output Summary (Last 24 hours) at 09/02/11 1323 Last data filed at 09/02/11 0443  Gross per 24 hour  Intake    720 ml  Output    300 ml  Net    420 ml    General: resting in bed, NAD HEENT: frontal and maxillary sinus min ttp b/l, no scleral icterus Cardiac: RRR, no rubs, murmurs or gallops Pulm: intermittently coughing during exam, exp wheezes b/l and course breath sounds b/l Abd: soft, nontender, nondistended, BS present (normal), obese Ext: warm and well perfused, no pedal edema; erythema improving to b/l lower ext b/l; no edema to 1+ edema L>R Neuro: alert and oriented X3  Lab Results: Basic Metabolic Panel:  Lab 09/02/11 9562 09/01/11 0650  NA 142 144  K 3.9 3.6  CL 103 105  CO2 31 30  GLUCOSE 99 100*  BUN 8 7  CREATININE 0.65 0.65  CALCIUM 9.6 9.3  MG 1.9 1.7  PHOS 3.8 3.7   Liver Function Tests:  Lab 08/30/11 0600  AST 13  ALT 12  ALKPHOS 86  BILITOT 0.2*  PROT 6.3  ALBUMIN 2.5*   CBC:  Lab 09/02/11 0625 09/01/11 0650  WBC 6.1 7.0  NEUTROABS 4.3 5.1  HGB 11.2* 11.1*  HCT 33.8* 33.5*  MCV 93.9 91.5  PLT 329 326   Urinalysis:  Lab 08/29/11 2114  COLORURINE YELLOW  LABSPEC 1.018  PHURINE 6.0  GLUCOSEU NEGATIVE    HGBUR NEGATIVE  BILIRUBINUR NEGATIVE  KETONESUR NEGATIVE  PROTEINUR NEGATIVE  UROBILINOGEN 0.2  NITRITE NEGATIVE  LEUKOCYTESUR NEGATIVE   Misc. Labs:none  Studies/Results: No results found. Medications:  Scheduled Meds:    . albuterol  2.5 mg Nebulization TID  . ALPRAZolam  0.5 mg Oral BID  . amLODipine  5 mg Oral Daily  . azaTHIOprine  50 mg Oral Daily  . enoxaparin (LOVENOX) injection  40 mg Subcutaneous Daily  . fesoterodine  8 mg Oral Daily  . ipratropium  0.5 mg Nebulization TID  . lisinopril  20 mg Oral QHS  . Mesalamine  800 mg Oral TID  . moxifloxacin  400 mg Oral Q2000  . oxymetazoline  1 spray Each Nare BID  . pantoprazole  40 mg Oral Daily  . PARoxetine  40 mg Oral Daily  . simvastatin  10 mg Oral q1800  . sodium chloride  3 mL Intravenous Q12H  . DISCONTD: albuterol  2.5 mg Nebulization Q6H  . DISCONTD: ipratropium  0.5 mg Nebulization Q6H   Continuous Infusions:    . 0.9 % NaCl with KCl 20 mEq / L 75 mL/hr  at 09/01/11 1341   PRN Meds:.acetaminophen, acetaminophen, albuterol, dextromethorphan-guaiFENesin, pseudoephedrine, sodium chloride Assessment/Plan: 56 y.o PMH Rheumatoid arthritis, Crohns, HTN, HLD, anxiety, GERD, h/o bronchitis.  Patient presented 8/24 with worsening cough and reports of missing last remicade dose as well as b/l lower ext edema, redness, pain.   1.Community acquired pneumonia/URI/Acute bronchitis -previously tx'ed with Doxy for CAP -Continue Avelox x 14 days (will d/c with 12 more days),  -will d/c with Atrovent 2 puff q6 and Albuterol prn -resume home Mucinex -URI associated with decreased hearing if this continues see ENT outpatient -will need outpt pfts to eval lungs since pt has been chronic smoker in the past but denies current use -will monitor VS -Dr. Randa Evens will repeat CXR at f/u appt with him  2. Moderate sinusitis -noted on CT maxillo facial scan today with diffuse air/fluid levels in frontal sinuses b/l  -Cont  Avelox (started 8/26 x 12 more days), Sudafed and affrin -pt to take Affrin x 3 days prn, Sudafed prn  3. Hypokalemia. resolved -loss could be 2/2 chronic diarrhea 2/2 Crohns per Dr. Randa Evens   4. Hypomag, resolved  -monitoring electrolytes, Mag 1.9  5. H/o Hypertension -BP 136/69 -Lisinopril 20 mg qd, Norvasc 5 mg qd  6. Overactive bladder  -cont Toviaz 8 mg qd  7. Crohn's disease (08/30/2011) -s/p ileocolectomy with ileum and cecum resection -Dr. Randa Evens' office will call for outpatient appt for Remicade after 2 weeks with full course duration antibiotic treatment x 14 days and resolution of active infections -at follow up Dr. Randa Evens will repeat CXR to make sure acute infections have resolved prior to receiving remicade again  8. B/l legs with erythema, pain -improving -will monitor -etiology could be skin manifestation of crohns (i.e erythema nodosum per Dr. Randa Evens vs cellulits) -when pt able to get Remicade this will help plus she is on Abx currently  9. F/E/N -NS with K 20 meq 75 cc/hr will d/c today  -diet ordered   10. Dispo -home when w/u complete possibly 8/28   LOS: 4 days   [name] [phone] 09/02/2011, 1:23 PM

## 2011-09-02 NOTE — Progress Notes (Signed)
Internal Medicine Attending  Date: 09/02/2011  Patient name: Jessica [name] Medical record number: [id number] Date of birth: November 14, 1955 Age: 56 y.o. Gender: female  I saw and evaluated the patient and discussed her care on a.m. rounds with house staff. I reviewed the resident's note by Dr. Shirlee Latch and I agree with the resident's findings and plans as documented in her note.

## 2011-09-02 NOTE — Progress Notes (Signed)
Patient 98% on room air upon ambulating in the hallway. Patient ambulated down the hallway to the nurses station on room air. Patient's oxygen was 98% on room air at the end of her walk.

## 2011-09-02 NOTE — Progress Notes (Signed)
Patient discharged with discharge instructions and prescriptions. Patient was given instruction by respiratory on how to use inhaler and spacer. Patient was given nose spray and told to use for a short while, along with sudafed and to watch blood pressure. Patient was given education on smoking cessation. Patient was stable upon discharge home.

## 2011-09-02 NOTE — Progress Notes (Signed)
Utilization review completed.  

## 2011-09-02 NOTE — Discharge Summary (Signed)
Internal Medicine Teaching [facility] Discharge Note  Name: Jessica [name] MRN: [id number] DOB: [birth year] 56 y.o.  Date of Admission: 08/29/2011 10:29 PM Date of Discharge: 09/02/2011 Attending Physician: Farley Ly, MD  Discharge Diagnosis: 1)  *Community acquired pneumonia /Acute URI 2) Acute Bronchitis 3)Sinusitis 4)Crohn's disease 5) Hypertension 6) Anemia of chronic disease  7) Resolved electrolyte abnormalities (low Magnesium, Low potassium)   Discharge Medications: Medication List  As of 09/02/2011  5:23 PM   STOP taking these medications         doxycycline 100 MG tablet      REMICADE IV         TAKE these medications         albuterol 108 (90 BASE) MCG/ACT inhaler   Commonly known as: PROVENTIL HFA;VENTOLIN HFA   Inhale 2 puffs into the lungs every 6 (six) hours as needed.      ALPRAZolam 0.5 MG tablet   Commonly known as: XANAX   Take 0.5 mg by mouth 2 (two) times daily.      amLODipine 5 MG tablet   Commonly known as: NORVASC   Take 1 tablet (5 mg total) by mouth daily.      azaTHIOprine 50 MG tablet   Commonly known as: IMURAN   Take 50 mg by mouth daily.      CALCIUM 600 + D PO   Take 1 tablet by mouth 2 (two) times daily.      DELZICOL 400 MG Cpdr   Generic drug: Mesalamine   Take 800 mg by mouth 3 (three) times daily.      esomeprazole 40 MG capsule   Commonly known as: NEXIUM   Take 40 mg by mouth daily before breakfast.      Fish Oil 1000 MG Caps   Take 1,000 mg by mouth 3 (three) times daily.      GLUCOSAMINE CHONDR 1500 COMPLX PO   Take 1 tablet by mouth 2 (two) times daily.      guaiFENesin 600 MG 12 hr tablet   Commonly known as: MUCINEX   Take 1,200 mg by mouth 2 (two) times daily.      ipratropium 17 MCG/ACT inhaler   Commonly known as: ATROVENT HFA   Inhale 2 puffs into the lungs every 6 (six) hours.      lisinopril 20 MG tablet   Commonly known as: PRINIVIL,ZESTRIL   Take 20 mg by mouth at bedtime.     lovastatin 20 MG tablet   Commonly known as: MEVACOR   Take 20 mg by mouth at bedtime.      moxifloxacin 400 MG tablet   Commonly known as: AVELOX   Take 1 tablet (400 mg total) by mouth daily at 8 pm.      oxymetazoline 0.05 % nasal spray   Commonly known as: AFRIN   Place 1 spray into the nose 2 (two) times daily.      PARoxetine 40 MG tablet   Commonly known as: PAXIL   Take 40 mg by mouth every morning.      pseudoephedrine 30 MG tablet   Commonly known as: SUDAFED   Take 1 tablet (30 mg total) by mouth every 4 (four) hours as needed for congestion.      TOVIAZ 8 MG Tb24   Generic drug: fesoterodine   Take 8 mg by mouth daily.            Disposition and follow-up:   Jessica [name] was discharged  from [facility] in stable condition.  At the hospital follow up visit please address  1) Dr. Randa Evens will repeat CXR after patient has completed antibiotics 2) BMP, CBC labs, Mag, Phos 3) order pulmonary function tests 4) If hearing loss persists follow up with ENT outpatient  Follow-up Appointments:  Discharge Orders    Future Appointments: Provider: Department: Dept Phone: Center:   09/15/2011 9:30 AM Leodis Sias, MD [facility] [phone] [facility]     Future Orders Please Complete By Expires   Increase activity slowly      Discharge instructions      Comments:   Follow up appointment 1)Internal Medicine Clinic 09/15/11 at 9:30 am-Dr. Tonny Branch [phone] 2) Dr. Randa Evens office will call you with an appointment.  They are trying to work you in within 12 days.  If you do not hear from them call them immediately to get an appointment after you have completed your antibiotic   Call MD for:  temperature >100.4      Call MD for:      Comments:   Shortness of breath or if you are not feeling better soon      Consultations: Treatment Team:  Vertell Novak., MD  Procedures Performed:  Dg Chest 2 View  08/31/2011  *RADIOLOGY  REPORT*  Clinical Data: Chronic cough.  CHEST - 2 VIEW  Comparison: 08/29/2011  Findings: Heart size is normal.  No pleural effusion or edema. There is atelectasis and consolidation is noted in the left base. This is similar to the previous examination.  IMPRESSION:  1.  Persistent left base atelectasis and airspace consolidation   Original Report Authenticated By: Rosealee Albee, M.D.    Dg Chest 2 View  08/29/2011  *RADIOLOGY REPORT*  Clinical Data: Cough and congestion.  Fever.  CHEST - 2 VIEW  Comparison: None.  Findings: Streaky left base density is worrisome for left lower lobe pneumonia.  Plate-like subsegmental atelectasis.  No effusion or pneumothorax. Mild interstitial prominence noted throughout both lung fields.  Normal heart size.  No hilar or mediastinal masses. Calcified tortuous aorta.  Levoconvex mid thoracic scoliosis.  IMPRESSION: Probable early left lower lobe pneumonia. Associated plate-like atelectasis.   Original Report Authenticated By: Elsie Stain, M.D.    Ct Maxillofacial Wo Cm  08/31/2011  *RADIOLOGY REPORT*  Clinical Data: Cough.  Stuffiness.  Rule out sinusitis.  CT MAXILLOFACIAL WITHOUT CONTRAST  Technique:  Multidetector CT imaging of the maxillofacial structures was performed. Multiplanar CT image reconstructions were also generated.  Comparison: None  Findings: Mild to moderate mucosal edema in the maxillary and ethmoid sinuses bilaterally.  Mucosal thickening also in the frontal sinuses.  Probable air-fluid levels in the frontal sinuses bilaterally.  Mucosal thickening is present in the sphenoid sinus bilaterally.  Mucosal edema/effusion in the mastoid sinus bilaterally with partial opacification of the mastoid tip bilaterally.  Mild soft tissue thickening in the middle ear on the right.  Left middle ear is clear.  No acute bony abnormality.  No mass or edema in the orbit.  IMPRESSION: Moderate sinusitis diffusely.  Probable air-fluid levels in the frontal sinus  bilaterally.  No acute bony change.   Original Report Authenticated By: Camelia Phenes, M.D.     Admission HPI:  56yo female with PMH of Crohn's disease, rheumatoid arthritis and HTN who presents to the ED with a 3 week history of worsening productive cough with fevers that have not resolved after 12 day treatment  of Doxycycline from her PCP. She states that at the end of July she began with a sore throat that persisted until Aug 6th when she developed a productive cough, sinus pressure, full ears, and fevers. She states that Aug 6th was the day she was to receive her next dose of Remicade from her Gastroenterologist, Dr. Randa Evens for her Crohn's disease, but she did not recveive the injection because she was not feeling well. She went to her PCP and was given Doxycycline x 7days for pneumonia and a sinus infection. After finishing, she did not feel any better, and on Aug 21st she went back to her PCP and was given 5 more days of Doxycycline, without improvement. The productive cough and fullness in her ears have persisted, and today she asked her children to bring her to [facility] from her home town of [place], [place]. She also endorses erythema and pain at multiple sites on the anterior portion of her ankle that was 1st noticed on her left ankle on Tuesday and on her right ankle Sat. Am. She denied any trauma to the sites.   In the ED a CXR showed possible early LLL pneumonia with atelectasis, a UA was negative, and her white count was normal. She was found to be hypokalemic to 2.5 and was replaced in the ED. She was also given a breathing treatment with Atrovent and started on Rocephin and Azithromycin for CAP.  Admission Physical Exam:  Blood pressure 166/71, pulse 72, temperature 98.2 F (36.8 C), temperature source Oral, resp. rate 18, height 5\' 7"  (1.702 m), weight 216 lb 8 oz (98.204 kg), SpO2 99.00%.  Constitutional: Vital signs reviewed. Patient is a well-developed and well-nourished female in no  acute distress and cooperative with exam. Alert and oriented x3.  Head: Normocephalic and atraumatic  Eyes: Injected, conjunctiva, PERRL, EOMI, no scleral icterus.  Neck: Supple, Trachea midline normal ROM, No JVD, mass, thyromegaly, or carotid bruit present.  Cardiovascular: RRR, pulses symmetric and intact bilaterally. Non-pitting pedal edema  Pulmonary/Chest: Productive cough with course breath sounds throughout.  Abdominal: Obese, soft, non-tender, non-distended, bowel sounds are normal, no masses, organomegaly, or guarding present.  GU: No CVA tenderness Musculoskeletal: No joint deformities, ROM full and no non tender.  Neurological: A&O x3, Strength is normal and symmetric bilaterally, cranial nerve II-XII are grossly intact, no focal motor deficit, sensory intact to light touch bilaterally.  Skin: Warm, dry and intact. 2 Left sided and 1 right 1-2 cm diameter circumferential areas at the anterior ankles with erythema, warmth, and tenderness that are non fluctuant.  Psychiatric: Normal mood and affect. speech and behavior is normal. Judgment and thought content normal. Cognition and memory are normal.   Hospital Course by problem list: 1)  *Community acquired pneumonia (CAP) /Acute URI This patient was previously treated with Doxycycline for two courses for community acquired pneumonia but failed this treatment outpatient.  She was started on Rocephin and Azithromycin on admission then transitioned to oral Avelox 400 mg x 2 days (we will discharge with 12 more days) for a total duration of 14 days of treatment.  She needs longer duration of treatment because she was previously possibly immunocompromised due to a history of taking Remicade for Crohns.    2) Acute Bronchitis She has been a chronic smoker and quit smoking within the past one to two months (early August).  She was given Albuterol nebulizer this admission and discharged with Atrovent 2 puff q6 and Albuterol prn.  She will need  outpatient pulmonary function tests.  She can resume her home Mucinex to help with cough.    3)Sinusitis She had imaging this admission which confirmed moderate sinusitis.  She was started on Avelox and is to complete 14 days duration.  She was given Sudafed and Affrin this admission due to her sinuses being tender.  These medications are not to be used long term preferably three days and the patient was informed.  She also complains of hearing loss.  If this is not resolved by one to two weeks I recommend following up with ENT.    4)Crohn's disease She is status post an ileocolectomy with ileum and cecum resection.  She follows with Dr. Carman Ching (gastroenterology in [place], [place]) for Remicade infusions. She recently missed her 08/2011 dose of Remicade due to feeling ill.  She will possibly receive Remicade if her repeat CXR after antibiotics is negative.  She will have a repeat CXR at a follow up appointment with Dr. Randa Evens and he will decide at that time.  This admission she also had erythematous tender lesions to bilateral lower extremities which improved with antibiotics.  The lesions could have been cellulitic in nature or related to her Crohns disease (i.e erythema nodosum)  5) History of Hypertension She has a history of hypertension and her BP was 136/69 on the day of discharge.  She was already taking Lisinopril 20 mg daily but we added Norvasc 5 mg daily this admission for blood pressure control.     6) Anemia of chronic disease  She had an anemia panel this admission consistent with anemia of chronic disease.  She has known Crohns as her chronic disease.    7) Resolved electrolyte abnormalities  She had hypokalemia on admission and hypomagnesium on admission which resolved by day of discharge.  She was on Normal saline with 20 mEQ on K this admission.    She was given Lovenox this admission for DVT prophylaxis.   Discharge Vitals:  BP 113/75  Pulse 75  Temp 98.4 F (36.9 C)  (Oral)  Resp 18  Ht 5\' 7"  (1.702 m)  Wt 220 lb 10.9 oz (100.1 kg)  BMI 34.56 kg/m2  SpO2 98%  Physical exam on discharge General: resting in bed, NAD  HEENT: frontal and maxillary sinus min ttp b/l, no scleral icterus  Cardiac: RRR, no rubs, murmurs or gallops  Pulm: intermittently coughing during exam, exp wheezes b/l and course breath sounds b/l  Abd: soft, nontender, nondistended, BS present (normal), obese  Ext: warm and well perfused, no pedal edema; erythema improving to b/l lower ext b/l; no edema to 1+ edema L>R  Neuro: alert and oriented X3  Discharge Labs:  Results for orders placed during the hospital encounter of 08/29/11 (from the past 24 hour(s))  BASIC METABOLIC PANEL     Status: Normal   Collection Time   09/02/11  6:25 AM      Component Value Range   Sodium 142  135 - 145 mEq/L   Potassium 3.9  3.5 - 5.1 mEq/L   Chloride 103  96 - 112 mEq/L   CO2 31  19 - 32 mEq/L   Glucose, Bld 99  70 - 99 mg/dL   BUN 8  6 - 23 mg/dL   Creatinine, Ser 4.09  0.50 - 1.10 mg/dL   Calcium 9.6  8.4 - 81.1 mg/dL   GFR calc non Af Amer >90  >90 mL/min   GFR calc Af Amer >90  >90 mL/min  CBC WITH DIFFERENTIAL     Status: Abnormal   Collection Time   09/02/11  6:25 AM      Component Value Range   WBC 6.1  4.0 - 10.5 K/uL   RBC 3.60 (*) 3.87 - 5.11 MIL/uL   Hemoglobin 11.2 (*) 12.0 - 15.0 g/dL   HCT 16.1 (*) 09.6 - 04.5 %   MCV 93.9  78.0 - 100.0 fL   MCH 31.1  26.0 - 34.0 pg   MCHC 33.1  30.0 - 36.0 g/dL   RDW 40.9  81.1 - 91.4 %   Platelets 329  150 - 400 K/uL   Neutrophils Relative 72  43 - 77 %   Neutro Abs 4.3  1.7 - 7.7 K/uL   Lymphocytes Relative 20  12 - 46 %   Lymphs Abs 1.2  0.7 - 4.0 K/uL   Monocytes Relative 6  3 - 12 %   Monocytes Absolute 0.4  0.1 - 1.0 K/uL   Eosinophils Relative 2  0 - 5 %   Eosinophils Absolute 0.1  0.0 - 0.7 K/uL   Basophils Relative 0  0 - 1 %   Basophils Absolute 0.0  0.0 - 0.1 K/uL  MAGNESIUM     Status: Normal   Collection Time    09/02/11  6:25 AM      Component Value Range   Magnesium 1.9  1.5 - 2.5 mg/dL  PHOSPHORUS     Status: Normal   Collection Time   09/02/11  6:25 AM      Component Value Range   Phosphorus 3.8  2.3 - 4.6 mg/dL   Results for CHYNAH, ORIHUELA (MRN [id number]) as of 09/02/2011 16:40  Ref. Range 08/29/2011 19:17 08/30/2011 06:00 08/30/2011 07:00 08/30/2011 12:30 08/30/2011 22:23  Sodium Latest Range: 135-145 mEq/L 141 144   143  Potassium Latest Range: 3.5-5.1 mEq/L 2.5 (LL) 2.7 (LL)  2.8 (L) 3.7  Chloride Latest Range: 96-112 mEq/L 92 (L) 98   101  CO2 Latest Range: 19-32 mEq/L  38 (H)   36 (H)  BUN Latest Range: 6-23 mg/dL 8 11   9   Creatinine Latest Range: 0.50-1.10 mg/dL 0.86 5.78   4.69  Calcium Latest Range: 8.4-10.5 mg/dL  8.9   9.3  GFR calc non Af Amer Latest Range: >90 mL/min  >90   75 (L)  GFR calc Af Amer Latest Range: >90 mL/min  >90   86 (L)  Glucose Latest Range: 70-99 mg/dL 95 98   88  Calcium Ionized Latest Range: 1.12-1.23 mmol/L 1.15      Magnesium Latest Range: 1.5-2.5 mg/dL   1.1 (L) 2.1   Alkaline Phosphatase Latest Range: 39-117 U/L  86     Albumin Latest Range: 3.5-5.2 g/dL  2.5 (L)     AST Latest Range: 0-37 U/L  13     ALT Latest Range: 0-35 U/L  12     Total Protein Latest Range: 6.0-8.3 g/dL  6.3     Total Bilirubin Latest Range: 0.3-1.2 mg/dL  0.2 (L)      Results for JOHNELL, LANDOWSKI (MRN [id number]) as of 09/02/2011 16:40  Ref. Range 08/31/2011 05:39  Cholesterol Latest Range: 0-200 mg/dL 244  Triglycerides Latest Range: <150 mg/dL 010  HDL Latest Range: >39 mg/dL 49  LDL (calc) Latest Range: 0-99 mg/dL 62  VLDL Latest Range: 0-40 mg/dL 22  Total CHOL/HDL Ratio No range found 2.7   Results for ROXI, HLAVATY (MRN [id number]) as of 09/02/2011 16:40  Ref. Range 09/01/2011 08:43  Iron Latest Range: 42-135 ug/dL 31 (L)  UIBC Latest Range: 125-400 ug/dL 960  TIBC Latest Range: 250-470 ug/dL 454 (L)  Saturation Ratios Latest Range: 20-55 % 13 (L)  Ferritin  Latest Range: 10-291 ng/mL 94  Folate No range found 12.2  Vitamin B-12 Latest Range: 211-911 pg/mL 227   Results for ELAINE, ROANHORSE (MRN [id number]) as of 09/02/2011 16:40  Ref. Range 08/29/2011 21:14  Color, Urine Latest Range: YELLOW  YELLOW  APPearance Latest Range: CLEAR  HAZY (A)  Specific Gravity, Urine Latest Range: 1.005-1.030  1.018  pH Latest Range: 5.0-8.0  6.0  Glucose Latest Range: NEGATIVE mg/dL NEGATIVE  Bilirubin Urine Latest Range: NEGATIVE  NEGATIVE  Ketones, ur Latest Range: NEGATIVE mg/dL NEGATIVE  Protein Latest Range: NEGATIVE mg/dL NEGATIVE  Urobilinogen, UA Latest Range: 0.0-1.0 mg/dL 0.2  Nitrite Latest Range: NEGATIVE  NEGATIVE  Leukocytes, UA Latest Range: NEGATIVE  NEGATIVE  Hgb urine dipstick Latest Range: NEGATIVE  NEGATIVE   Results for INOCENCIA, MURTAUGH (MRN [id number]) as of 09/02/2011 16:40  Ref. Range 08/30/2011 18:03  Potassium Urine Timed No range found 8  Sodium, Ur No range found 107     Signed: [name] 09/02/2011, 5:23 PM   Time Spent on Discharge: <30 minutes

## 2011-09-09 ENCOUNTER — Telehealth: Payer: Self-pay | Admitting: *Deleted

## 2011-09-09 NOTE — Telephone Encounter (Signed)
Reviewed patients chart.  She was ordered ipratropium for bronchitis related to pneumonia.  Spiriva would not be appropriate in this setting.  She has albuterol, which is treating the same thing and will likely be sufficient.  If you could call the patient and let her know she doesn't need the atrovent and can discuss with further with Dr. Tonny Branch when she comes in to her outpatient visit.

## 2011-09-09 NOTE — Telephone Encounter (Signed)
Received PA request from [facility] [address] [place] for pt's Atrovent HFA.  Pt was discharged from our service on 09/02/2011. She is not our patient, but has a HFU visit on 09/15/11 with Dr Tonny Branch.  Per pharmacy, spiriva is the preferred medication for this pt's plan.  Insurance will have to be contacted at [phone] to initiate PA or MD can change to [place].  Will forward to prescribing MD for advise.Criss Alvine, Darlene Cassady9/4/201311:04 AM

## 2011-09-15 ENCOUNTER — Other Ambulatory Visit: Payer: Self-pay | Admitting: Gastroenterology

## 2011-09-15 ENCOUNTER — Encounter: Payer: Self-pay | Admitting: Internal Medicine

## 2011-09-15 ENCOUNTER — Ambulatory Visit
Admission: RE | Admit: 2011-09-15 | Discharge: 2011-09-15 | Disposition: A | Discharge status: Home / Self Care | Payer: Medicare Other | Source: Ambulatory Visit | Attending: Gastroenterology | Admitting: Gastroenterology

## 2011-09-15 ENCOUNTER — Ambulatory Visit (INDEPENDENT_AMBULATORY_CARE_PROVIDER_SITE_OTHER): Payer: Medicare Other | Admitting: Internal Medicine

## 2011-09-15 VITALS — BP 122/76 | HR 68 | Temp 97.7°F | Ht 67.0 in | Wt 216.9 lb

## 2011-09-15 DIAGNOSIS — J189 Pneumonia, unspecified organism: Secondary | ICD-10-CM

## 2011-09-15 DIAGNOSIS — J42 Unspecified chronic bronchitis: Secondary | ICD-10-CM

## 2011-09-15 DIAGNOSIS — K5090 Crohn's disease, unspecified, without complications: Secondary | ICD-10-CM

## 2011-09-15 DIAGNOSIS — I10 Essential (primary) hypertension: Secondary | ICD-10-CM

## 2011-09-15 DIAGNOSIS — M1990 Unspecified osteoarthritis, unspecified site: Secondary | ICD-10-CM

## 2011-09-15 LAB — CBC WITH DIFFERENTIAL/PLATELET
Basophils Absolute: 0 10*3/uL (ref 0.0–0.1)
Basophils Relative: 1 % (ref 0–1)
HCT: 32.7 % — ABNORMAL LOW (ref 36.0–46.0)
Lymphocytes Relative: 30 % (ref 12–46)
MCHC: 33.3 g/dL (ref 30.0–36.0)
Monocytes Absolute: 0.3 10*3/uL (ref 0.1–1.0)
Neutro Abs: 4.1 10*3/uL (ref 1.7–7.7)
Platelets: 401 10*3/uL — ABNORMAL HIGH (ref 150–400)
RDW: 13.7 % (ref 11.5–15.5)
WBC: 6.5 10*3/uL (ref 4.0–10.5)

## 2011-09-15 LAB — PHOSPHORUS: Phosphorus: 3.3 mg/dL (ref 2.3–4.6)

## 2011-09-15 LAB — BASIC METABOLIC PANEL
BUN: 9 mg/dL (ref 6–23)
Creat: 0.75 mg/dL (ref 0.50–1.10)
Potassium: 3.4 mEq/L — ABNORMAL LOW (ref 3.5–5.3)

## 2011-09-15 LAB — MAGNESIUM: Magnesium: 1.6 mg/dL (ref 1.5–2.5)

## 2011-09-15 MED ORDER — DICLOFENAC SODIUM 1 % TD GEL: 2.0000 g | Freq: Four times a day (QID) | TRANSDERMAL | Status: DC | PRN

## 2011-09-15 NOTE — Progress Notes (Signed)
Subjective:   Patient ID: Jessica [name] female   DOB: 01/19/1955 56 y.o.   MRN: [id number]  HPI: [name] is a 56 y.o. woman who is seen today to establish care in the [facility] as well as in hospital follow up.  She was hospitalized from 08/29/11 to 09/02/11 for community acquired pneumonia.  She has a PMH of Chron's disease and is followed by Jessica [name] of [facility].  She also carries a diagnosis of chronic bronchitis treated with occasional albuterol, generalized anxiety, and osteoarthritis.  She is a former smoker that quit in early August.  Per her discharge summary from the hospital they would like to check a CBC to follow her anemia of chronic disease, a repeat Bmet, Mg, and phos because she was found to be deficient in the hospital likely secondary to her previous ilectomy and chronic Chron's disease, and to schedule her for outpatient PFTs. She will also need a repeat X-ray today to see if she can be scheduled for her Remicade injection by Jessica [name].    See Problem focused Assessment and Plan for full details.   Past Medical History  Diagnosis Date  . Rheumatoid arthritis   . Crohn's disease   . Hypertension   . Overactive bladder   . Hyperlipidemia   . Anxiety   . GERD (gastroesophageal reflux disease)   . Bronchitis    Current Outpatient Prescriptions  Medication Sig Dispense Refill  . albuterol (PROVENTIL HFA;VENTOLIN HFA) 108 (90 BASE) MCG/ACT inhaler Inhale 2 puffs into the lungs every 6 (six) hours as needed.      . ALPRAZolam (XANAX) 0.5 MG tablet Take 0.5 mg by mouth 2 (two) times daily.      Marland Kitchen amLODipine (NORVASC) 5 MG tablet Take 1 tablet (5 mg total) by mouth daily.  30 tablet  1  . azaTHIOprine (IMURAN) 50 MG tablet Take 50 mg by mouth daily.      . Calcium Carbonate-Vitamin D (CALCIUM 600 + D PO) Take 1 tablet by mouth 2 (two) times daily.      Marland Kitchen esomeprazole (NEXIUM) 40 MG capsule Take 40 mg by mouth daily before breakfast.      . fesoterodine (TOVIAZ) 8  MG TB24 Take 8 mg by mouth daily.      . Glucosamine-Chondroit-Vit C-Mn (GLUCOSAMINE CHONDR 1500 COMPLX PO) Take 1 tablet by mouth 2 (two) times daily.      Marland Kitchen guaiFENesin (MUCINEX) 600 MG 12 hr tablet Take 1,200 mg by mouth 2 (two) times daily.      Marland Kitchen ipratropium (ATROVENT HFA) 17 MCG/ACT inhaler Inhale 2 puffs into the lungs every 6 (six) hours.  1 Inhaler  12  . lisinopril (PRINIVIL,ZESTRIL) 20 MG tablet Take 20 mg by mouth at bedtime.      . lovastatin (MEVACOR) 20 MG tablet Take 20 mg by mouth at bedtime.      . Mesalamine (DELZICOL) 400 MG CPDR Take 800 mg by mouth 3 (three) times daily.      . Omega-3 Fatty Acids (FISH OIL) 1000 MG CAPS Take 1,000 mg by mouth 3 (three) times daily.      Marland Kitchen PARoxetine (PAXIL) 40 MG tablet Take 40 mg by mouth every morning.       Family History  Problem Relation Age of Onset  . Heart disease Mother     died from pain med OD  . Cancer Paternal Grandmother     Colon cancer  . Heart disease Father   .  Diabetes Maternal Grandmother    History   Social History  . Marital Status: Widowed    Spouse Name: N/A    Number of Children: N/A  . Years of Education: N/A   Social History Main Topics  . Smoking status: Former Smoker -- 2.0 packs/day for 44 years    Types: Cigarettes    Quit date: 07/30/2011  . Smokeless tobacco: None  . Alcohol Use: No  . Drug Use: No  . Sexually Active: None   Other Topics Concern  . None   Social History Narrative  . None   Review of Systems: A full 12 system ROS is negative except as noted in the HPI and A&P.   Objective:  Physical Exam: Filed Vitals:   09/15/11 0940  BP: 122/76  Pulse: 68  Temp: 97.7 F (36.5 C)  TempSrc: Oral  Height: 5\' 7"  (1.702 m)  Weight: 216 lb 14.4 oz (98.385 kg)  SpO2: 98%   Constitutional: Vital signs reviewed.  Patient is a well-developed and well-nourished native [place] woman in no acute distress and cooperative with exam. Alert and oriented x3.  Head: Normocephalic and  atraumatic Ear: TM normal bilaterally Mouth: no erythema or exudates, MMM Eyes: PERRL, EOMI, conjunctivae normal, No scleral icterus.  Neck: Supple, Trachea midline normal ROM, No JVD, mass, thyromegaly, or carotid bruit present.  Cardiovascular: RRR, S1 normal, S2 normal, no MRG, pulses symmetric and intact bilaterally Pulmonary/Chest: mild expiratory wheezes but otherwise CTAB, no rales, or rhonchi Abdominal: Soft. Non-tender, non-distended, bowel sounds are normal, no masses, organomegaly, or guarding present.  GU: no CVA tenderness Musculoskeletal: No joint deformities, erythema, or stiffness, ROM full and no nontender Hematology: no cervical, inginal, or axillary adenopathy.  Neurological: A&O x3, Strength is normal and symmetric bilaterally, cranial nerve II-XII are grossly intact, no focal motor deficit, sensory intact to light touch bilaterally.  Skin: Warm, dry and intact. No rash, cyanosis, or clubbing.  Psychiatric: Normal mood and affect. speech and behavior is normal. Judgment and thought content normal. Cognition and memory are normal.   Assessment & Plan:

## 2011-09-15 NOTE — Patient Instructions (Signed)
1.  Stop in the lab to have your blood test drawn.  We will talk about them at your follow up or I will call you if we need to follow up earlier then that.  2.  We will schedule you for the pulmonary function tests.  We will discuss that at your follow up.  3.  We will get your records faxed to [place].    4.  Follow up with [place] about 2 weeks after your pulmonary function tests.

## 2011-09-16 NOTE — Progress Notes (Signed)
Pt aware of appt for PFT 09/18/11 1PM at [facility]. Instructions given. Stanton Kidney Laquenta Whitsell RN 09/16/11 9AM

## 2011-09-18 ENCOUNTER — Ambulatory Visit (HOSPITAL_COMMUNITY)
Admission: RE | Admit: 2011-09-18 | Discharge: 2011-09-18 | Disposition: A | Discharge status: Home / Self Care | Payer: Medicare Other | Source: Ambulatory Visit | Attending: Internal Medicine | Admitting: Internal Medicine

## 2011-09-18 DIAGNOSIS — J42 Unspecified chronic bronchitis: Secondary | ICD-10-CM | POA: Insufficient documentation

## 2011-09-18 MED ORDER — ALBUTEROL SULFATE (5 MG/ML) 0.5% IN NEBU
2.5000 mg | INHALATION_SOLUTION | Freq: Once | RESPIRATORY_TRACT | Status: AC
  Administered 2011-09-18: 2.5 mg via RESPIRATORY_TRACT

## 2011-09-23 ENCOUNTER — Encounter (HOSPITAL_COMMUNITY)
Admission: RE | Admit: 2011-09-23 | Discharge: 2011-09-23 | Disposition: A | Discharge status: Home / Self Care | Payer: Medicare Other | Source: Ambulatory Visit | Attending: Gastroenterology | Admitting: Gastroenterology

## 2011-09-23 DIAGNOSIS — K5090 Crohn's disease, unspecified, without complications: Secondary | ICD-10-CM | POA: Insufficient documentation

## 2011-09-23 MED ORDER — LORATADINE 10 MG PO TABS
10.0000 mg | ORAL_TABLET | Freq: Every day | ORAL | Status: DC
  Administered 2011-09-23: 10 mg via ORAL
  Filled 2011-09-23: qty 1

## 2011-09-23 MED ORDER — ACETAMINOPHEN 500 MG PO TABS
1000.0000 mg | ORAL_TABLET | Freq: Once | ORAL | Status: AC
  Administered 2011-09-23: 1000 mg via ORAL
  Filled 2011-09-23: qty 1

## 2011-09-23 MED ORDER — SODIUM CHLORIDE 0.9 % IV SOLN
600.0000 mg | INTRAVENOUS | Status: DC
  Filled 2011-09-23: qty 60

## 2011-09-23 MED ORDER — SODIUM CHLORIDE 0.9 % IV SOLN
INTRAVENOUS | Status: DC
  Administered 2011-09-23: 11:00:00 via INTRAVENOUS

## 2011-10-09 ENCOUNTER — Other Ambulatory Visit: Payer: Self-pay | Admitting: *Deleted

## 2011-10-12 ENCOUNTER — Other Ambulatory Visit: Payer: Self-pay | Admitting: Internal Medicine

## 2011-10-12 MED ORDER — ALPRAZOLAM 0.5 MG PO TABS: 0.5000 mg | ORAL_TABLET | Freq: Two times a day (BID) | ORAL | Status: DC

## 2011-10-12 NOTE — Telephone Encounter (Signed)
Rx called in 

## 2011-10-20 ENCOUNTER — Ambulatory Visit (INDEPENDENT_AMBULATORY_CARE_PROVIDER_SITE_OTHER): Payer: Medicare Other | Admitting: Internal Medicine

## 2011-10-20 ENCOUNTER — Encounter: Payer: Self-pay | Admitting: Licensed Clinical Social Worker

## 2011-10-20 ENCOUNTER — Encounter: Payer: Self-pay | Admitting: Internal Medicine

## 2011-10-20 VITALS — BP 120/80 | HR 62 | Temp 98.3°F | Ht 67.0 in | Wt 223.2 lb

## 2011-10-20 DIAGNOSIS — M1990 Unspecified osteoarthritis, unspecified site: Secondary | ICD-10-CM

## 2011-10-20 DIAGNOSIS — Z636 Dependent relative needing care at home: Secondary | ICD-10-CM

## 2011-10-20 DIAGNOSIS — K5090 Crohn's disease, unspecified, without complications: Secondary | ICD-10-CM

## 2011-10-20 DIAGNOSIS — Z23 Encounter for immunization: Secondary | ICD-10-CM

## 2011-10-20 DIAGNOSIS — Z6379 Other stressful life events affecting family and household: Secondary | ICD-10-CM

## 2011-10-20 DIAGNOSIS — I10 Essential (primary) hypertension: Secondary | ICD-10-CM

## 2011-10-20 DIAGNOSIS — D649 Anemia, unspecified: Secondary | ICD-10-CM

## 2011-10-20 MED ORDER — ESOMEPRAZOLE MAGNESIUM 40 MG PO CPDR: 40.0000 mg | DELAYED_RELEASE_CAPSULE | Freq: Every day | ORAL | Status: DC

## 2011-10-20 MED ORDER — DICLOFENAC SODIUM 1 % TD GEL: 2.0000 g | Freq: Four times a day (QID) | TRANSDERMAL | Status: AC | PRN

## 2011-10-20 MED ORDER — AMLODIPINE BESYLATE 5 MG PO TABS: 5.0000 mg | ORAL_TABLET | Freq: Every day | ORAL | Status: DC

## 2011-10-20 MED ORDER — POTASSIUM CHLORIDE ER 10 MEQ PO TBCR: 20.0000 meq | EXTENDED_RELEASE_TABLET | Freq: Every day | ORAL | Status: DC

## 2011-10-20 MED ORDER — FESOTERODINE FUMARATE ER 8 MG PO TB24: 8.0000 mg | ORAL_TABLET | Freq: Every day | ORAL | Status: DC

## 2011-10-20 NOTE — Progress Notes (Signed)
  Subjective:    Patient ID: Jessica [name], female    DOB: [birth year], 56 y.o.   MRN: [id number]  HPI Presents today for results of PFTs.  Denies sob, chest pain or cough.  States that she only gets sob when she gets upset.  Requesting refills of medications.     Review of Systems  Constitutional: Negative for fever and fatigue.  HENT: Negative for congestion.   Eyes: Negative for visual disturbance.  Respiratory: Negative for cough, chest tightness and shortness of breath.   Cardiovascular: Negative for chest pain.  Gastrointestinal: Negative for blood in stool.  Musculoskeletal: Positive for arthralgias. Negative for joint swelling.  Skin: Negative for rash.  Neurological: Negative for dizziness and headaches.       Objective:   Physical Exam  Constitutional: She is oriented to person, place, and time. She appears well-developed and well-nourished. No distress.  HENT:  Head: Normocephalic and atraumatic.  Eyes: Conjunctivae normal and EOM are normal. Pupils are equal, round, and reactive to light.  Neck: Normal range of motion. Neck supple.  Cardiovascular: Normal rate, regular rhythm and normal heart sounds.   Pulmonary/Chest: Effort normal and breath sounds normal.  Abdominal: Soft. Bowel sounds are normal.  Musculoskeletal: Normal range of motion.  Neurological: She is alert and oriented to person, place, and time.  Skin: Skin is warm and dry.  Psychiatric: She has a normal mood and affect. Her behavior is normal. Judgment and thought content normal.          Assessment & Plan:  1. Chronic bronchitis: normal spirometry and DLCO on PFTs w/o evidence of COPD, no cough today 2. Caregiver burden: refer to SW for assistance with paraplegic drug addicted brother whom pt cares for and is causes much stress in the household.

## 2011-10-20 NOTE — Patient Instructions (Signed)
I have refilled your medications. Take as prescribed. Your PFTs do not show bad lung disease. We will give you the flu shot today. Follow-up with me in 1 year or earlier if needed.

## 2011-10-20 NOTE — Progress Notes (Signed)
Jessica [name] presents for her scheduled [facility] appt.  Pt's PCP referred pt to CSW.  CSW met with pt.  Jessica [name] lives in [place], [place].  Pt state she is the primary caregiver to her two minor nieces.  Pt's sister and brother are currently living with pt.  Pt states brother is w/c bound, bilateral amputee.  Brother receives [facility] and [facility] RN for wound care.  Currently, brother is a stressor in pt's life because of his lifestyle choices.  Pt states brother has a substance abuse problem and lacks the desire of care for his hygiene.  Pt inquiring about placement options for her brother.  CSW inquired if brother had desire to move into AL or SNF, pt states no.  Discussed the difficulty due to brother being A&O and able to make his own decisions.  Discussed possibility of pt calling brother's PCP to see if MSW could be added on to [facility] RN currently receiving and encouraged pt to contact brother's Medicaid case worker for options.  CSW provided Jessica [name] with affordable housing apt in [place], one indicating it is handicap accessible.  Encouraged pt to place brother wait list for his own apt.  Jessica [name] has CSW contact information and is aware CSW is available to assist as needed.

## 2011-10-21 ENCOUNTER — Encounter: Payer: Self-pay | Admitting: Licensed Clinical Social Worker

## 2011-11-17 ENCOUNTER — Other Ambulatory Visit (HOSPITAL_COMMUNITY): Payer: Self-pay | Admitting: *Deleted

## 2011-11-18 ENCOUNTER — Encounter (HOSPITAL_COMMUNITY)
Admission: RE | Admit: 2011-11-18 | Discharge: 2011-11-18 | Disposition: A | Discharge status: Home / Self Care | Payer: Medicare Other | Source: Ambulatory Visit | Attending: Gastroenterology | Admitting: Gastroenterology

## 2011-11-18 DIAGNOSIS — K5090 Crohn's disease, unspecified, without complications: Secondary | ICD-10-CM | POA: Insufficient documentation

## 2011-11-18 MED ORDER — ACETAMINOPHEN 500 MG PO TABS
1000.0000 mg | ORAL_TABLET | Freq: Once | ORAL | Status: AC
  Administered 2011-11-18: 1000 mg via ORAL

## 2011-11-18 MED ORDER — LORATADINE 10 MG PO TABS
ORAL_TABLET | ORAL | Status: AC
  Administered 2011-11-18: 10 mg via ORAL
  Filled 2011-11-18: qty 1

## 2011-11-18 MED ORDER — ACETAMINOPHEN 500 MG PO TABS
ORAL_TABLET | ORAL | Status: AC
  Filled 2011-11-18: qty 2

## 2011-11-18 MED ORDER — SODIUM CHLORIDE 0.9 % IV SOLN: Freq: Once | INTRAVENOUS | Status: DC

## 2011-11-18 MED ORDER — SODIUM CHLORIDE 0.9 % IV SOLN
600.0000 mg | Freq: Once | INTRAVENOUS | Status: AC
  Administered 2011-11-18: 600 mg via INTRAVENOUS
  Filled 2011-11-18: qty 60

## 2011-11-18 MED ORDER — LORATADINE 10 MG PO TABS
10.0000 mg | ORAL_TABLET | Freq: Every day | ORAL | Status: DC
  Administered 2011-11-18: 10 mg via ORAL

## 2011-12-08 ENCOUNTER — Other Ambulatory Visit: Payer: Self-pay | Admitting: *Deleted

## 2011-12-08 NOTE — Telephone Encounter (Signed)
Refill denied 

## 2011-12-08 NOTE — Telephone Encounter (Signed)
Last refill 10/7 

## 2011-12-15 NOTE — Telephone Encounter (Signed)
Pt informed refill denied.   Scheduled appointment with Dr Bosie Clos to discuss this matter.  Will see on Thursday.

## 2011-12-17 ENCOUNTER — Encounter: Payer: Self-pay | Admitting: Internal Medicine

## 2011-12-17 ENCOUNTER — Ambulatory Visit (INDEPENDENT_AMBULATORY_CARE_PROVIDER_SITE_OTHER): Payer: Medicare Other | Admitting: Internal Medicine

## 2011-12-17 VITALS — BP 122/79 | HR 72 | Temp 97.5°F | Ht 66.0 in | Wt 228.1 lb

## 2011-12-17 DIAGNOSIS — Z0000 Encounter for general adult medical examination without abnormal findings: Secondary | ICD-10-CM

## 2011-12-17 DIAGNOSIS — I10 Essential (primary) hypertension: Secondary | ICD-10-CM

## 2011-12-17 DIAGNOSIS — E876 Hypokalemia: Secondary | ICD-10-CM

## 2011-12-17 DIAGNOSIS — F411 Generalized anxiety disorder: Secondary | ICD-10-CM

## 2011-12-17 DIAGNOSIS — F410 Panic disorder [episodic paroxysmal anxiety] without agoraphobia: Secondary | ICD-10-CM | POA: Insufficient documentation

## 2011-12-17 LAB — BASIC METABOLIC PANEL
Chloride: 102 mEq/L (ref 96–112)
Potassium: 4.3 mEq/L (ref 3.5–5.3)

## 2011-12-17 MED ORDER — BUSPIRONE HCL 15 MG PO TABS: 15.0000 mg | ORAL_TABLET | Freq: Two times a day (BID) | ORAL | Status: DC

## 2011-12-17 NOTE — Assessment & Plan Note (Signed)
Will transition from Xanax to Buspar 15 mg bid prn anxiety #60 3 refills

## 2011-12-17 NOTE — Addendum Note (Signed)
Addended by: Manuela Schwartz on: 12/17/2011 09:31 AM   Modules accepted: Level of Service

## 2011-12-17 NOTE — Progress Notes (Signed)
  Subjective:    Patient ID: Jessica [name], female    DOB: February 14, 1955, 56 y.o.   MRN: [id number]  HPI  Pt presents to clinic for reassessment of anxiety medication.  She is fairly new to this practice and had requested a refill of Xanax which she states that she has been taking for "thirty years". She does continue to have significant home stressors and reports that she has "gotten down" to taking one 0.5 mg Xanax in the evening to "make the pills last".  We discussed at length the dangers and concerns of chronic benzodiazepine usage and the need to transition her to an agent with less adverse effects with long-term use.  Review of Systems  Constitutional: Negative for fatigue.  Respiratory: Negative for shortness of breath.   Cardiovascular: Negative for chest pain and palpitations.  Musculoskeletal: Positive for arthralgias.  Neurological: Positive for headaches. Negative for dizziness and weakness.  Psychiatric/Behavioral: Negative for dysphoric mood. The patient is nervous/anxious.        Objective:   Physical Exam  Constitutional: She is oriented to person, place, and time. She appears well-developed and well-nourished. No distress.       [place] female, granddaughter present  HENT:  Head: Normocephalic and atraumatic.  Eyes: Conjunctivae normal and EOM are normal. Pupils are equal, round, and reactive to light.  Pulmonary/Chest: Effort normal and breath sounds normal.  Neurological: She is alert and oriented to person, place, and time.  Psychiatric: She has a normal mood and affect. Her behavior is normal. Judgment and thought content normal.          Assessment & Plan:  1. Generalized anxiety: within setting of caregiver burden, pt amenable to transition to Buspar from Xanax -will start 15 mg po bid, pt to contact clinic if she feels the dosage is not controlling her symptoms (can be titrated up to 60 mg qd (bid-tid dosing)   2. Htn: at goal  on lisinopril 20 mg qd and amlodipine 5 mg qd  3. Preventative Care: will refer for Mammogram, colonoscopy scheduling to be deferred to Dr. Randa Evens of [facility] who manages her Crohn's disease.

## 2011-12-17 NOTE — Assessment & Plan Note (Signed)
BP Readings from Last 3 Encounters:  12/17/11 122/79  11/18/11 169/94  10/20/11 120/80    Lab Results  Component Value Date   NA 141 09/15/2011   K 3.4* 09/15/2011   CREATININE 0.75 09/15/2011    Assessment:  Blood pressure control: controlled  Progress toward BP goal:  at goal  Comments:   Plan:  Medications:  continue current medications  Educational resources provided:    Self management tools provided:    Other plans: cont amlodipine 5 mg and lisinopril 20 mg qd

## 2011-12-17 NOTE — Patient Instructions (Addendum)
General Instructions:  We have transitioned you from Xanax to Buspar.  This is a safer drug for long-term use. If you feel that that the dosage is not controlling your anxiety, please call the clinic and we can increase the dosage. We will check your potassium level today.  If it is normal, we will stop the potassium pills. We will also schedule you for a mammogram.  Someone will call you with a date.  Treatment Goals:  Goals (1 Years of Data) as of 12/17/2011          As of Today 11/18/11 11/18/11 11/18/11 10/20/11     Blood Pressure    . Blood Pressure < 140/90  122/79 169/94 126/80 150/76 120/80      Progress Toward Treatment Goals:  Treatment Goal 12/17/2011  Blood pressure at goal    Self Care Goals & Plans:  Self Care Goal 12/17/2011  Manage my medications take my medicines as prescribed; bring my medications to every visit; refill my medications on time; follow the sick day instructions if I am sick  Monitor my health keep track of my weight  Eat healthy foods eat fruit for snacks and desserts; eat more vegetables; eat foods that are low in salt; eat baked foods instead of fried foods; drink diet soda or water instead of juice or soda  Be physically active join a walking program; find an activity I enjoy       Care Management & Community Referrals:   Buspirone tablets What is this medicine? BUSPIRONE (byoo SPYE rone) is used to treat anxiety disorders. This medicine may be used for other purposes; ask your health care provider or pharmacist if you have questions. What should I tell my health care provider before I take this medicine? They need to know if you have any of these conditions: -kidney or liver disease -an unusual or allergic reaction to buspirone, other medicines, foods, dyes, or preservatives -pregnant or trying to get pregnant -breast-feeding How should I use this medicine? Take this medicine by mouth with a glass of water. Follow the directions on  the prescription label. You may take this medicine with or without food. To ensure that this medicine always works the same way for you, you should take it either always with or always without food. Take your doses at regular intervals. Do not take your medicine more often than directed. Do not stop taking except on the advice of your doctor or health care professional. Talk to your pediatrician regarding the use of this medicine in children. Special care may be needed. Overdosage: If you think you have taken too much of this medicine contact a poison control center or emergency room at once. NOTE: This medicine is only for you. Do not share this medicine with others. What if I miss a dose? If you miss a dose, take it as soon as you can. If it is almost time for your next dose, take only that dose. Do not take double or extra doses. What may interact with this medicine? Do not take this medicine with any of the following medications: -linezolid -MAOIs like Carbex, Eldepryl, Marplan, Nardil, and Parnate -methylene blue -procarbazine This medicine may also interact with the following medications: -diazepam -digoxin -diltiazem -erythromycin -grapefruit juice -haloperidol -medicines for seizures like carbamazepine, phenobarbital and phenytoin -nefazodone -other medications for anxiety -rifampin -ritonavir -some antifungal medicines like itraconazole, ketoconazole, and voriconazole -verapamil -warfarin This list may not describe all possible interactions. Give your health care provider a  list of all the medicines, herbs, non-prescription drugs, or dietary supplements you use. Also tell them if you smoke, drink alcohol, or use illegal drugs. Some items may interact with your medicine. What should I watch for while using this medicine? Visit your doctor or health care professional for regular checks on your progress. It may take 1 to 2 weeks before your anxiety gets better. You may get drowsy  or dizzy. Do not drive, use machinery, or do anything that needs mental alertness until you know how this drug affects you. Do not stand or sit up quickly, especially if you are an older patient. This reduces the risk of dizzy or fainting spells. Alcohol can make you more drowsy and dizzy. Avoid alcoholic drinks. What side effects may I notice from receiving this medicine? Side effects that you should report to your doctor or health care professional as soon as possible: -blurred vision or other vision changes -chest pain -confusion -difficulty breathing -feelings of hostility or anger -muscle aches and pains -numbness or tingling in hands or feet -ringing in the ears -skin rash and itching -vomiting -weakness Side effects that usually do not require medical attention (report to your doctor or health care professional if they continue or are bothersome): -disturbed dreams, nightmares -headache -nausea -restlessness or nervousness -sore throat and nasal congestion -stomach upset This list may not describe all possible side effects. Call your doctor for medical advice about side effects. You may report side effects to FDA at 1-800-FDA-1088. Where should I keep my medicine? Keep out of the reach of children. Store at room temperature below 30 degrees C (86 degrees F). Protect from light. Keep container tightly closed. Throw away any unused medicine after the expiration date. NOTE: This sheet is a summary. It may not cover all possible information. If you have questions about this medicine, talk to your doctor, pharmacist, or health care provider.  2013, Elsevier/Gold Standard. (08/01/2009 6:06:11 PM)

## 2012-01-06 NOTE — Assessment & Plan Note (Signed)
She has completed her full 14 day course of Avelox and has been doing well.  She has some mild expiratory wheezes on exam today but denies productive cough, fever, chills, nausea, vomiting, or chest pain.  She will need a repeat X-ray today to document clearance of her pneumonia before she sees Dr. Randa Evens to see if she can reschedule her Remicade injection for her Chron's disease since she is about 1 month late for her injection.

## 2012-01-06 NOTE — Assessment & Plan Note (Signed)
09/15/11:  BP Readings from Last 2 Encounters:  09/15/11 122/76  09/02/11 113/75   Blood pressure is well controlled.  Continue current medications.

## 2012-01-06 NOTE — Assessment & Plan Note (Signed)
She has had a previous partial ilectomy for Chron's disease that she has had for over 20 years.  She has been maintained on Remicade and her current medications.  She is followed by Dr. Randa Evens of [facility].    Basic Metabolic Panel: Potassium (mEq/L)  Date Value  09/15/2011 3.4*  09/02/2011 3.9    Magnesium (mg/dL)  Date Value  5/62/1308 1.6   09/02/2011 1.9   09/01/2011 1.7      Phosphorus (mg/dL)  Date Value  6/57/8469 3.3   09/02/2011 3.8   09/01/2011 3.7    Her potassium is still mildly low likely from continued loose stools.  Her magnesium and phos are WNL.

## 2012-01-06 NOTE — Assessment & Plan Note (Signed)
Refill of her Voltaren gel today.  She reports good relief with this medication.

## 2012-01-06 NOTE — Assessment & Plan Note (Signed)
She states that she has been told that she has chronic bronchitis.  She has been using her albuterol inhaler for shortness of breath which she states is when she is either upset or if she walks a long distance.  She has not had PFTs previously.  She is a former smoker which could contribute.  We will schedule her for outpatient PFTs and we will follow up with her.  I encouraged her to continue to abstain from smoking and congratulated her on quitting.

## 2012-01-13 ENCOUNTER — Encounter (HOSPITAL_COMMUNITY)
Admission: RE | Admit: 2012-01-13 | Discharge: 2012-01-13 | Disposition: A | Discharge status: Home / Self Care | Payer: Medicare Other | Source: Ambulatory Visit | Attending: Gastroenterology | Admitting: Gastroenterology

## 2012-01-13 DIAGNOSIS — K5090 Crohn's disease, unspecified, without complications: Secondary | ICD-10-CM | POA: Insufficient documentation

## 2012-01-13 MED ORDER — ACETAMINOPHEN 500 MG PO TABS
1000.0000 mg | ORAL_TABLET | Freq: Once | ORAL | Status: AC
  Administered 2012-01-13: 1000 mg via ORAL

## 2012-01-13 MED ORDER — SODIUM CHLORIDE 0.9 % IV SOLN
600.0000 mg | Freq: Once | INTRAVENOUS | Status: AC
  Administered 2012-01-13: 600 mg via INTRAVENOUS
  Filled 2012-01-13: qty 60

## 2012-01-13 MED ORDER — LORATADINE 10 MG PO TABS
10.0000 mg | ORAL_TABLET | Freq: Every day | ORAL | Status: DC
  Administered 2012-01-13: 10 mg via ORAL

## 2012-01-13 MED ORDER — SODIUM CHLORIDE 0.9 % IV SOLN
Freq: Once | INTRAVENOUS | Status: AC
  Administered 2012-01-13: 09:00:00 via INTRAVENOUS

## 2012-01-13 MED ORDER — ACETAMINOPHEN 500 MG PO TABS
ORAL_TABLET | ORAL | Status: AC
  Administered 2012-01-13: 1000 mg via ORAL
  Filled 2012-01-13: qty 2

## 2012-01-13 MED ORDER — LORATADINE 10 MG PO TABS
ORAL_TABLET | ORAL | Status: AC
  Administered 2012-01-13: 10 mg via ORAL
  Filled 2012-01-13: qty 1

## 2012-01-14 ENCOUNTER — Other Ambulatory Visit: Payer: Self-pay | Admitting: *Deleted

## 2012-01-14 MED ORDER — PAROXETINE HCL 40 MG PO TABS: 40.0000 mg | ORAL_TABLET | ORAL | Status: DC

## 2012-02-08 ENCOUNTER — Other Ambulatory Visit: Payer: Self-pay | Admitting: *Deleted

## 2012-02-08 MED ORDER — LOVASTATIN 20 MG PO TABS: 20.0000 mg | ORAL_TABLET | Freq: Every day | ORAL | Status: DC

## 2012-02-08 MED ORDER — LISINOPRIL 20 MG PO TABS: 20.0000 mg | ORAL_TABLET | Freq: Every day | ORAL | Status: DC

## 2012-02-11 ENCOUNTER — Ambulatory Visit (HOSPITAL_COMMUNITY)
Admission: RE | Admit: 2012-02-11 | Discharge: 2012-02-11 | Disposition: A | Discharge status: Home / Self Care | Payer: Medicare Other | Source: Ambulatory Visit | Attending: Internal Medicine | Admitting: Internal Medicine

## 2012-02-11 DIAGNOSIS — Z0000 Encounter for general adult medical examination without abnormal findings: Secondary | ICD-10-CM

## 2012-02-11 DIAGNOSIS — Z1231 Encounter for screening mammogram for malignant neoplasm of breast: Secondary | ICD-10-CM | POA: Insufficient documentation

## 2012-02-26 ENCOUNTER — Telehealth: Payer: Self-pay | Admitting: *Deleted

## 2012-02-26 NOTE — Telephone Encounter (Signed)
Call from pt - states her insurance will not cover Toviaz 8mg ; she cannot afford $235.00/month. Can this be changed to another medication. Thanks

## 2012-03-04 NOTE — Telephone Encounter (Signed)
There are cheaper agents in this class but side-effects are common.  She has been on this for some time.  Please ask Dr. Bosie Clos to review why she is on Toviaz and consider changing to oxybutinin.

## 2012-03-08 ENCOUNTER — Other Ambulatory Visit (HOSPITAL_COMMUNITY): Payer: Self-pay | Admitting: *Deleted

## 2012-03-09 ENCOUNTER — Encounter (HOSPITAL_COMMUNITY)
Admission: RE | Admit: 2012-03-09 | Discharge: 2012-03-09 | Disposition: A | Discharge status: Home / Self Care | Payer: Medicare Other | Source: Ambulatory Visit | Attending: Gastroenterology | Admitting: Gastroenterology

## 2012-03-09 DIAGNOSIS — K5090 Crohn's disease, unspecified, without complications: Secondary | ICD-10-CM | POA: Insufficient documentation

## 2012-03-09 MED ORDER — LORATADINE 10 MG PO TABS: 10.0000 mg | ORAL_TABLET | Freq: Every day | ORAL | Status: DC

## 2012-03-09 MED ORDER — SODIUM CHLORIDE 0.9 % IV SOLN
Freq: Once | INTRAVENOUS | Status: AC
  Administered 2012-03-09: 09:00:00 via INTRAVENOUS

## 2012-03-09 MED ORDER — ACETAMINOPHEN 500 MG PO TABS: 1000.0000 mg | ORAL_TABLET | Freq: Once | ORAL | Status: DC

## 2012-03-09 MED ORDER — SODIUM CHLORIDE 0.9 % IV SOLN
600.0000 mg | INTRAVENOUS | Status: AC
  Administered 2012-03-09: 600 mg via INTRAVENOUS
  Filled 2012-03-09: qty 60

## 2012-03-10 NOTE — Telephone Encounter (Signed)
Call pt - states she cannot come back for another appt at this time d/t to traveling distance.Pt states she does have trouble w/her bladder-incontinence; she has to wear a "diaper". I called [facility] in [place] - she was able to check on 2 medications which will be covered by pt's insurance; Vesicare 5mg  and 10mg  also Ditropan ER 5mg , 10mg , and 15mg .

## 2012-03-10 NOTE — Telephone Encounter (Signed)
I am not the original prescriber for this agent.  My name became attached to the prescription because I refilled her medications. She was Thayer Ohm Pribula's patient and somehow has been reassigned to me after I saw her during my OPC month. Gala Murdoch is indicated for overactive bladder but I do not see that on her problem list. On review of her chart, she was on Toviaz when she admitted and discharged from the hospital. If she feels that she needs something for her bladder, please have her come into clinic to be evaluated.

## 2012-04-14 ENCOUNTER — Other Ambulatory Visit: Payer: Self-pay | Admitting: Internal Medicine

## 2012-04-28 ENCOUNTER — Encounter (HOSPITAL_COMMUNITY)
Admission: RE | Admit: 2012-04-28 | Discharge: 2012-04-28 | Disposition: A | Discharge status: Home / Self Care | Payer: Medicare Other | Source: Ambulatory Visit | Attending: Gastroenterology | Admitting: Gastroenterology

## 2012-04-28 ENCOUNTER — Ambulatory Visit (INDEPENDENT_AMBULATORY_CARE_PROVIDER_SITE_OTHER): Payer: Medicare Other | Admitting: Radiation Oncology

## 2012-04-28 ENCOUNTER — Encounter: Payer: Self-pay | Admitting: Radiation Oncology

## 2012-04-28 VITALS — BP 142/89 | HR 70 | Temp 98.1°F | Ht 67.0 in | Wt 245.4 lb

## 2012-04-28 DIAGNOSIS — K5090 Crohn's disease, unspecified, without complications: Secondary | ICD-10-CM | POA: Insufficient documentation

## 2012-04-28 DIAGNOSIS — M7989 Other specified soft tissue disorders: Secondary | ICD-10-CM | POA: Insufficient documentation

## 2012-04-28 LAB — D-DIMER, QUANTITATIVE: D-Dimer, Quant: <0.27 ug/mL-FEU (ref 0.00–0.48)

## 2012-04-28 MED ORDER — OXYBUTYNIN CHLORIDE ER 10 MG PO TB24: 10.0000 mg | ORAL_TABLET | Freq: Every day | ORAL | Status: DC

## 2012-04-28 MED ORDER — ACETAMINOPHEN 500 MG PO TABS: 1000.0000 mg | ORAL_TABLET | ORAL | Status: DC

## 2012-04-28 MED ORDER — DOXEPIN HCL 50 MG PO CAPS: 50.0000 mg | ORAL_CAPSULE | Freq: Every day | ORAL | Status: DC

## 2012-04-28 MED ORDER — LORATADINE 10 MG PO TABS: 10.0000 mg | ORAL_TABLET | ORAL | Status: DC

## 2012-04-28 MED ORDER — SODIUM CHLORIDE 0.9 % IV SOLN
INTRAVENOUS | Status: DC
  Administered 2012-04-28: 250 mL via INTRAVENOUS

## 2012-04-28 MED ORDER — DOXEPIN HCL 150 MG PO CAPS: 150.0000 mg | ORAL_CAPSULE | Freq: Every day | ORAL | Status: DC

## 2012-04-28 MED ORDER — SODIUM CHLORIDE 0.9 % IV SOLN
600.0000 mg | INTRAVENOUS | Status: DC
  Administered 2012-04-28: 600 mg via INTRAVENOUS
  Filled 2012-04-28: qty 60

## 2012-04-28 NOTE — Patient Instructions (Addendum)
We will contact you with the results of your lab work and inform you if you need additional testing or medications.   Contact us if you have worsened leg swelling or redness, or if you have fever or chills as this may mean you have an infection in your leg (cellulitis).   Take doxepin nightly for insomnia.   Stop taking toviaz and begin oxybutynin for urinary incontinence.   It was nice to meet you. Have a great day.

## 2012-04-28 NOTE — Assessment & Plan Note (Signed)
Patient's RLE edema/erythema is minimal. As the most likely diagnosis is chronic venous insufficiency, her Well's score = -2, indicating she is at low probability for DVT. She does have Crohn's disease, however, which would place her at somewhat increased risk for thromboembolism. As the patient's erythema is minimal and has been persistent and unchanged for 3 weeks, cellulitis is also unlikely.  - check d-dimer => Proceed with lower extremity Doppler if positive - patient instructed to go to the emergency room if she develops SOB, chest pain, or fevers/chills

## 2012-04-28 NOTE — Progress Notes (Signed)
Subjective:    Patient ID: Jessica [name], female    DOB: May 31, 1955, 57 y.o.   MRN: [id number]  HPI Patient presents to clinic today with complaints of RLE erythema and pain. She states the symptoms began rather suddenly (overnight) 3 weeks ago, and have persisted since that time. She indicates that the RLE pain and redness are localized to the lower half of the pretibial area and states that these symptoms are worse when her legs are in a dependent position. She has also recently noticed bilateral foot swelling with dependent positioning, with worsened swelling in the RLE as compared to the LLE while dependent. Denies chest pain or SOB. Denies fever or chills.  Jessica [name] complains of worsened insomnia after her xanax was discontinued and she was started on buspar. She denies difficulty falling asleep but states she typically awakens after only 1-2 hours of sleep.   Patient also admits to worsened symptoms of urinary urgency and incontinence due to not being able to afford her toviaz prescription.  Aside from the aforementioned issues, she has no other complaints today.   Review of Systems  All other systems reviewed and are negative.   Current Outpatient Medications: Current Outpatient Prescriptions  Medication Sig Dispense Refill  . albuterol (PROVENTIL HFA;VENTOLIN HFA) 108 (90 BASE) MCG/ACT inhaler Inhale 2 puffs into the lungs every 6 (six) hours as needed.      Marland Kitchen amLODipine (NORVASC) 5 MG tablet Take 1 tablet (5 mg total) by mouth daily.  30 tablet  6  . azaTHIOprine (IMURAN) 50 MG tablet Take 50 mg by mouth daily.      . busPIRone (BUSPAR) 15 MG tablet TAKE ONE TABLET BY MOUTH TWICE DAILY  60 tablet  0  . Calcium Carbonate-Vitamin D (CALCIUM 600 + D PO) Take 1 tablet by mouth 2 (two) times daily.      . diclofenac sodium (VOLTAREN) 1 % GEL Apply 2 g topically 4 (four) times daily as needed.  100 g  2  . doxepin (SINEQUAN) 50 MG capsule Take 1 capsule (50 mg total) by mouth at  bedtime.  30 capsule  1  . esomeprazole (NEXIUM) 40 MG capsule Take 1 capsule (40 mg total) by mouth daily before breakfast.  30 capsule  6  . Glucosamine-Chondroit-Vit C-Mn (GLUCOSAMINE CHONDR 1500 COMPLX PO) Take 1 tablet by mouth 2 (two) times daily.      Marland Kitchen lisinopril (PRINIVIL,ZESTRIL) 20 MG tablet Take 1 tablet (20 mg total) by mouth at bedtime.  90 tablet  1  . lovastatin (MEVACOR) 20 MG tablet Take 1 tablet (20 mg total) by mouth at bedtime.  90 tablet  1  . Mesalamine (DELZICOL) 400 MG CPDR Take 800 mg by mouth 3 (three) times daily.      . Omega-3 Fatty Acids (FISH OIL) 1000 MG CAPS Take 1,000 mg by mouth 3 (three) times daily.      Marland Kitchen oxybutynin (DITROPAN XL) 10 MG 24 hr tablet Take 1 tablet (10 mg total) by mouth daily.  30 tablet  2  . PARoxetine (PAXIL) 40 MG tablet Take 1 tablet (40 mg total) by mouth every morning.  30 tablet  6  . potassium chloride (K-DUR) 10 MEQ tablet Take 2 tablets (20 mEq total) by mouth daily.  30 tablet  1   No current facility-administered medications for this visit.   Facility-Administered Medications Ordered in Other Visits  Medication Dose Route Frequency Provider Last Rate Last Dose  . 0.9 %  sodium chloride  infusion   Intravenous Continuous Shirley Friar, MD 20 mL/hr at 04/28/12 0814 250 mL at 04/28/12 0814  . acetaminophen (TYLENOL) tablet 1,000 mg  1,000 mg Oral Q8 Weeks Shirley Friar, MD      . inFLIXimab (REMICADE) 600 mg in sodium chloride 0.9 % 250 mL infusion  600 mg Intravenous Q8 weeks Shirley Friar, MD 250 mL/hr at 04/28/12 1001 600 mg at 04/28/12 1001  . loratadine (CLARITIN) tablet 10 mg  10 mg Oral Q8 Weeks Shirley Friar, MD        Allergies: Allergies  Allergen Reactions  . Penicillins Anaphylaxis  . Prednisone Anaphylaxis  . Ace Inhibitors   . Dilaudid (Hydromorphone Hcl)     Causes bradycardia per pt  . Sulfa Drugs Cross Reactors Other (See Comments)    Lowers blood pressure  . Codeine Nausea And  Vomiting and Rash     Past Medical History: Past Medical History  Diagnosis Date  . Rheumatoid arthritis   . Crohn's disease   . Hypertension   . Overactive bladder   . Hyperlipidemia   . Anxiety   . GERD (gastroesophageal reflux disease)   . Bronchitis     Past Surgical History: Past Surgical History  Procedure Laterality Date  . Ileocolectomy  12/09/2001    Dr. Janee Morn  . Cholecystectomy    . Appendectomy    . Vaginal hysterectomy      Family History: Family History  Problem Relation Age of Onset  . Heart disease Mother     died from pain med OD  . Cancer Paternal Grandmother     Colon cancer  . Heart disease Father   . Diabetes Maternal Grandmother     Social History: History   Social History  . Marital Status: Widowed    Spouse Name: N/A    Number of Children: N/A  . Years of Education: N/A   Occupational History  . Not on file.   Social History Main Topics  . Smoking status: Former Smoker -- 2.00 packs/day for 44 years    Types: Cigarettes    Quit date: 07/30/2011  . Smokeless tobacco: Not on file  . Alcohol Use: No  . Drug Use: No  . Sexually Active: Not on file   Other Topics Concern  . Not on file   Social History Narrative  . No narrative on file     Vital Signs: Blood pressure 142/89, pulse 70, temperature 98.1 F (36.7 C), temperature source Oral, height 5\' 7"  (1.702 m), weight 245 lb 6.4 oz (111.313 kg), SpO2 96.00%.      Objective:   Physical Exam  Constitutional: She is oriented to person, place, and time. She appears well-developed and well-nourished. No distress.  HENT:  Head: Normocephalic and atraumatic.  Eyes: Conjunctivae are normal. Pupils are equal, round, and reactive to light. No scleral icterus.  Neck: Normal range of motion. Neck supple. No tracheal deviation present.  Cardiovascular: Normal rate and regular rhythm.   No murmur heard. Pulmonary/Chest: Effort normal. She has no wheezes. She has no rales.   Abdominal: Soft. Bowel sounds are normal. She exhibits no distension. There is no tenderness.  Musculoskeletal: Normal range of motion. She exhibits edema (trace pretibial pitting edema, circumferential erythema, and associated TTP in lower 1/2 of pretibial area of RLE. No increased warmth in RLE.  skin changes consistent with chronic venous insufficiency in BLE. LLE otherwise wnl. ).  Neurological: She is alert and oriented to person, place, and  time. No cranial nerve deficit.  Skin: Skin is warm and dry. No erythema.  Psychiatric: She has a normal mood and affect. Her behavior is normal.          Assessment & Plan:

## 2012-04-29 NOTE — Progress Notes (Signed)
Case discussed with Dr. McTyre at the time of the visit, immediately after the resident saw the patient.  I reviewed the resident's history and exam and pertinent patient test results.  I agree with the assessment, diagnosis and plan of care documented in the resident's note.  

## 2012-05-04 ENCOUNTER — Encounter (HOSPITAL_COMMUNITY): Payer: Medicare Other

## 2012-05-19 ENCOUNTER — Other Ambulatory Visit: Payer: Self-pay | Admitting: Internal Medicine

## 2012-05-19 DIAGNOSIS — F411 Generalized anxiety disorder: Secondary | ICD-10-CM

## 2012-05-19 NOTE — Telephone Encounter (Signed)
Dr Bosie Clos is on NF and pt states she is completely out; can u refill?  Thanks Last OV 4.24.14

## 2012-06-02 ENCOUNTER — Other Ambulatory Visit: Payer: Self-pay | Admitting: Internal Medicine

## 2012-06-16 ENCOUNTER — Other Ambulatory Visit: Payer: Self-pay | Admitting: Internal Medicine

## 2012-06-21 ENCOUNTER — Encounter (HOSPITAL_COMMUNITY)
Admission: RE | Admit: 2012-06-21 | Discharge: 2012-06-21 | Disposition: A | Discharge status: Home / Self Care | Payer: Medicare Other | Source: Ambulatory Visit | Attending: Gastroenterology | Admitting: Gastroenterology

## 2012-06-21 DIAGNOSIS — K5090 Crohn's disease, unspecified, without complications: Secondary | ICD-10-CM | POA: Insufficient documentation

## 2012-06-21 MED ORDER — SODIUM CHLORIDE 0.9 % IV SOLN
600.0000 mg | INTRAVENOUS | Status: AC
  Administered 2012-06-21: 600 mg via INTRAVENOUS
  Filled 2012-06-21: qty 60

## 2012-06-21 MED ORDER — ACETAMINOPHEN 500 MG PO TABS: 1000.0000 mg | ORAL_TABLET | ORAL | Status: DC

## 2012-06-21 MED ORDER — LORATADINE 10 MG PO TABS: 10.0000 mg | ORAL_TABLET | ORAL | Status: DC

## 2012-06-21 MED ORDER — SODIUM CHLORIDE 0.9 % IV SOLN
INTRAVENOUS | Status: DC
  Administered 2012-06-21: 10:00:00 via INTRAVENOUS

## 2012-06-23 ENCOUNTER — Encounter (HOSPITAL_COMMUNITY): Payer: Medicare Other

## 2012-07-01 ENCOUNTER — Other Ambulatory Visit: Payer: Self-pay | Admitting: Internal Medicine

## 2012-07-01 ENCOUNTER — Other Ambulatory Visit: Payer: Self-pay | Admitting: *Deleted

## 2012-07-03 MED ORDER — DOXEPIN HCL 50 MG PO CAPS: 50.0000 mg | ORAL_CAPSULE | Freq: Every day | ORAL | Status: DC

## 2012-08-02 ENCOUNTER — Other Ambulatory Visit: Payer: Self-pay | Admitting: Internal Medicine

## 2012-08-12 ENCOUNTER — Other Ambulatory Visit: Payer: Self-pay | Admitting: *Deleted

## 2012-08-15 ENCOUNTER — Other Ambulatory Visit (HOSPITAL_COMMUNITY): Payer: Self-pay | Admitting: *Deleted

## 2012-08-16 ENCOUNTER — Encounter (HOSPITAL_COMMUNITY)
Admission: RE | Admit: 2012-08-16 | Discharge: 2012-08-16 | Disposition: A | Discharge status: Home / Self Care | Payer: Medicare Other | Source: Ambulatory Visit | Attending: Gastroenterology | Admitting: Gastroenterology

## 2012-08-16 DIAGNOSIS — K5090 Crohn's disease, unspecified, without complications: Secondary | ICD-10-CM | POA: Insufficient documentation

## 2012-08-16 MED ORDER — SODIUM CHLORIDE 0.9 % IV SOLN
600.0000 mg | INTRAVENOUS | Status: DC
  Administered 2012-08-16: 600 mg via INTRAVENOUS
  Filled 2012-08-16: qty 60

## 2012-08-16 MED ORDER — SODIUM CHLORIDE 0.9 % IV SOLN
INTRAVENOUS | Status: DC
  Administered 2012-08-16: 250 mL via INTRAVENOUS

## 2012-08-16 MED ORDER — ACETAMINOPHEN 500 MG PO TABS: 1000.0000 mg | ORAL_TABLET | ORAL | Status: DC

## 2012-08-16 MED ORDER — LORATADINE 10 MG PO TABS: 10.0000 mg | ORAL_TABLET | ORAL | Status: DC

## 2012-08-17 ENCOUNTER — Other Ambulatory Visit: Payer: Self-pay | Admitting: Internal Medicine

## 2012-08-17 MED ORDER — PAROXETINE HCL 40 MG PO TABS: 40.0000 mg | ORAL_TABLET | ORAL | Status: DC

## 2012-08-17 MED ORDER — OXYBUTYNIN CHLORIDE ER 10 MG PO TB24: 10.0000 mg | ORAL_TABLET | Freq: Every day | ORAL | Status: DC

## 2012-09-04 ENCOUNTER — Other Ambulatory Visit: Payer: Self-pay | Admitting: Internal Medicine

## 2012-09-06 ENCOUNTER — Other Ambulatory Visit: Payer: Self-pay | Admitting: Internal Medicine

## 2012-10-05 ENCOUNTER — Other Ambulatory Visit: Payer: Self-pay | Admitting: Internal Medicine

## 2012-10-11 ENCOUNTER — Inpatient Hospital Stay (HOSPITAL_COMMUNITY): Admission: RE | Admit: 2012-10-11 | Payer: Medicare Other | Source: Ambulatory Visit

## 2012-10-17 ENCOUNTER — Encounter (HOSPITAL_COMMUNITY)
Admission: RE | Admit: 2012-10-17 | Discharge: 2012-10-17 | Disposition: A | Discharge status: Home / Self Care | Payer: Medicare Other | Source: Ambulatory Visit | Attending: Gastroenterology | Admitting: Gastroenterology

## 2012-10-17 DIAGNOSIS — K5090 Crohn's disease, unspecified, without complications: Secondary | ICD-10-CM | POA: Insufficient documentation

## 2012-10-17 MED ORDER — ACETAMINOPHEN 500 MG PO TABS: 1000.0000 mg | ORAL_TABLET | ORAL | Status: DC

## 2012-10-17 MED ORDER — SODIUM CHLORIDE 0.9 % IV SOLN
600.0000 mg | INTRAVENOUS | Status: AC
  Administered 2012-10-17: 600 mg via INTRAVENOUS
  Filled 2012-10-17: qty 60

## 2012-10-17 MED ORDER — SODIUM CHLORIDE 0.9 % IV SOLN
INTRAVENOUS | Status: AC
  Administered 2012-10-17: 09:00:00 via INTRAVENOUS

## 2012-10-17 MED ORDER — LORATADINE 10 MG PO TABS
10.0000 mg | ORAL_TABLET | ORAL | Status: DC
Start: 2012-10-17 — End: 2012-10-18

## 2012-10-17 NOTE — Progress Notes (Signed)
Pt denies leaving country or being around anyone who has been out of the country or ill

## 2012-11-07 ENCOUNTER — Other Ambulatory Visit: Payer: Self-pay | Admitting: *Deleted

## 2012-11-08 MED ORDER — AMLODIPINE BESYLATE 5 MG PO TABS: ORAL_TABLET | ORAL | Status: DC

## 2012-11-08 MED ORDER — LOVASTATIN 20 MG PO TABS: ORAL_TABLET | ORAL | Status: DC

## 2012-11-08 MED ORDER — LISINOPRIL 20 MG PO TABS: ORAL_TABLET | ORAL | Status: DC

## 2012-11-14 ENCOUNTER — Other Ambulatory Visit: Payer: Self-pay | Admitting: Internal Medicine

## 2012-12-06 ENCOUNTER — Other Ambulatory Visit: Payer: Self-pay | Admitting: Internal Medicine

## 2012-12-09 ENCOUNTER — Encounter (HOSPITAL_COMMUNITY): Payer: Medicare Other

## 2012-12-09 ENCOUNTER — Encounter (HOSPITAL_COMMUNITY)
Admission: RE | Admit: 2012-12-09 | Discharge: 2012-12-09 | Disposition: A | Discharge status: Home / Self Care | Payer: Medicare Other | Source: Ambulatory Visit | Attending: Gastroenterology | Admitting: Gastroenterology

## 2012-12-09 DIAGNOSIS — K5090 Crohn's disease, unspecified, without complications: Secondary | ICD-10-CM | POA: Insufficient documentation

## 2012-12-09 MED ORDER — SODIUM CHLORIDE 0.9 % IV SOLN
600.0000 mg | INTRAVENOUS | Status: DC
  Administered 2012-12-09: 600 mg via INTRAVENOUS
  Filled 2012-12-09: qty 60

## 2012-12-09 MED ORDER — SODIUM CHLORIDE 0.9 % IV SOLN
INTRAVENOUS | Status: DC
  Administered 2012-12-09: 10:00:00 via INTRAVENOUS

## 2012-12-09 MED ORDER — LORATADINE 10 MG PO TABS: 10.0000 mg | ORAL_TABLET | Freq: Every day | ORAL | Status: DC

## 2012-12-09 MED ORDER — ACETAMINOPHEN 500 MG PO TABS: 1000.0000 mg | ORAL_TABLET | ORAL | Status: DC

## 2012-12-12 ENCOUNTER — Encounter (HOSPITAL_COMMUNITY): Payer: Medicare Other

## 2012-12-15 ENCOUNTER — Other Ambulatory Visit: Payer: Self-pay | Admitting: Internal Medicine

## 2012-12-15 MED ORDER — ESOMEPRAZOLE MAGNESIUM 40 MG PO CPDR: DELAYED_RELEASE_CAPSULE | ORAL | Status: DC

## 2012-12-15 NOTE — Telephone Encounter (Signed)
Pt requesting 90 day supply. Thanks 

## 2012-12-15 NOTE — Telephone Encounter (Signed)
Call from pt - out of medication; requesting 90 day supply.  Thanks

## 2013-02-02 ENCOUNTER — Other Ambulatory Visit (HOSPITAL_COMMUNITY): Payer: Self-pay | Admitting: *Deleted

## 2013-02-03 ENCOUNTER — Encounter (HOSPITAL_COMMUNITY): Payer: Medicare Other

## 2013-02-06 ENCOUNTER — Other Ambulatory Visit: Payer: Self-pay | Admitting: Internal Medicine

## 2013-02-09 ENCOUNTER — Other Ambulatory Visit (HOSPITAL_COMMUNITY): Payer: Self-pay | Admitting: *Deleted

## 2013-02-09 ENCOUNTER — Encounter (HOSPITAL_COMMUNITY): Payer: Medicare Other

## 2013-02-10 ENCOUNTER — Encounter (HOSPITAL_COMMUNITY)
Admission: RE | Admit: 2013-02-10 | Discharge: 2013-02-10 | Disposition: A | Discharge status: Home / Self Care | Payer: Medicare Other | Source: Ambulatory Visit | Attending: Gastroenterology | Admitting: Gastroenterology

## 2013-02-10 DIAGNOSIS — K5090 Crohn's disease, unspecified, without complications: Secondary | ICD-10-CM | POA: Insufficient documentation

## 2013-02-10 MED ORDER — ACETAMINOPHEN 500 MG PO TABS: 1000.0000 mg | ORAL_TABLET | ORAL | Status: DC

## 2013-02-10 MED ORDER — SODIUM CHLORIDE 0.9 % IV SOLN
600.0000 mg | INTRAVENOUS | Status: AC
  Administered 2013-02-10: 600 mg via INTRAVENOUS
  Filled 2013-02-10: qty 60

## 2013-02-10 MED ORDER — LORATADINE 10 MG PO TABS: 10.0000 mg | ORAL_TABLET | ORAL | Status: DC

## 2013-02-10 MED ORDER — SODIUM CHLORIDE 0.9 % IV SOLN
INTRAVENOUS | Status: AC
  Administered 2013-02-10: 09:00:00 via INTRAVENOUS

## 2013-03-13 ENCOUNTER — Other Ambulatory Visit: Payer: Self-pay | Admitting: Internal Medicine

## 2013-04-07 ENCOUNTER — Other Ambulatory Visit (HOSPITAL_COMMUNITY): Payer: Self-pay | Admitting: *Deleted

## 2013-04-07 ENCOUNTER — Encounter (HOSPITAL_COMMUNITY): Payer: Medicare Other

## 2013-04-10 ENCOUNTER — Encounter (HOSPITAL_COMMUNITY)
Admission: RE | Admit: 2013-04-10 | Discharge: 2013-04-10 | Disposition: A | Discharge status: Home / Self Care | Payer: Medicare Other | Source: Ambulatory Visit | Attending: Gastroenterology | Admitting: Gastroenterology

## 2013-04-10 ENCOUNTER — Ambulatory Visit (INDEPENDENT_AMBULATORY_CARE_PROVIDER_SITE_OTHER): Payer: Medicare Other | Admitting: Internal Medicine

## 2013-04-10 ENCOUNTER — Encounter: Payer: Self-pay | Admitting: Internal Medicine

## 2013-04-10 VITALS — BP 124/69 | HR 58 | Temp 98.3°F | Ht 67.0 in | Wt 260.8 lb

## 2013-04-10 DIAGNOSIS — K5090 Crohn's disease, unspecified, without complications: Secondary | ICD-10-CM

## 2013-04-10 DIAGNOSIS — F410 Panic disorder [episodic paroxysmal anxiety] without agoraphobia: Secondary | ICD-10-CM

## 2013-04-10 DIAGNOSIS — J42 Unspecified chronic bronchitis: Secondary | ICD-10-CM

## 2013-04-10 DIAGNOSIS — D638 Anemia in other chronic diseases classified elsewhere: Secondary | ICD-10-CM

## 2013-04-10 DIAGNOSIS — G4700 Insomnia, unspecified: Secondary | ICD-10-CM

## 2013-04-10 DIAGNOSIS — F411 Generalized anxiety disorder: Secondary | ICD-10-CM

## 2013-04-10 DIAGNOSIS — Z0000 Encounter for general adult medical examination without abnormal findings: Secondary | ICD-10-CM | POA: Insufficient documentation

## 2013-04-10 DIAGNOSIS — D649 Anemia, unspecified: Secondary | ICD-10-CM

## 2013-04-10 DIAGNOSIS — I10 Essential (primary) hypertension: Secondary | ICD-10-CM

## 2013-04-10 LAB — CBC WITH DIFFERENTIAL/PLATELET
Basophils Absolute: 0 10*3/uL (ref 0.0–0.1)
Basophils Relative: 0 % (ref 0–1)
EOS PCT: 1 % (ref 0–5)
Eosinophils Absolute: 0.1 10*3/uL (ref 0.0–0.7)
HEMATOCRIT: 37.3 % (ref 36.0–46.0)
Hemoglobin: 12.4 g/dL (ref 12.0–15.0)
LYMPHS ABS: 2.5 10*3/uL (ref 0.7–4.0)
LYMPHS PCT: 41 % (ref 12–46)
MCH: 29.7 pg (ref 26.0–34.0)
MCHC: 33.2 g/dL (ref 30.0–36.0)
MCV: 89.4 fL (ref 78.0–100.0)
Monocytes Absolute: 0.4 10*3/uL (ref 0.1–1.0)
Monocytes Relative: 7 % (ref 3–12)
NEUTROS ABS: 3.1 10*3/uL (ref 1.7–7.7)
Neutrophils Relative %: 51 % (ref 43–77)
PLATELETS: 317 10*3/uL (ref 150–400)
RBC: 4.17 MIL/uL (ref 3.87–5.11)
RDW: 15.1 % (ref 11.5–15.5)
WBC: 6.1 10*3/uL (ref 4.0–10.5)

## 2013-04-10 LAB — BASIC METABOLIC PANEL WITH GFR
BUN: 8 mg/dL (ref 6–23)
CHLORIDE: 102 meq/L (ref 96–112)
CO2: 29 meq/L (ref 19–32)
Calcium: 9.2 mg/dL (ref 8.4–10.5)
Creat: 1.02 mg/dL (ref 0.50–1.10)
GFR, EST NON AFRICAN AMERICAN: 61 mL/min
GFR, Est African American: 70 mL/min
Glucose, Bld: 90 mg/dL (ref 70–99)
Potassium: 4.1 mEq/L (ref 3.5–5.3)
Sodium: 141 mEq/L (ref 135–145)

## 2013-04-10 MED ORDER — ALBUTEROL SULFATE HFA 108 (90 BASE) MCG/ACT IN AERS: 2.0000 | INHALATION_SPRAY | Freq: Four times a day (QID) | RESPIRATORY_TRACT | Status: AC | PRN

## 2013-04-10 MED ORDER — OXYBUTYNIN CHLORIDE ER 10 MG PO TB24: ORAL_TABLET | ORAL | Status: DC

## 2013-04-10 MED ORDER — ACETAMINOPHEN 500 MG PO TABS: 1000.0000 mg | ORAL_TABLET | Freq: Once | ORAL | Status: DC

## 2013-04-10 MED ORDER — SODIUM CHLORIDE 0.9 % IV SOLN
INTRAVENOUS | Status: DC
  Administered 2013-04-10: 250 mL via INTRAVENOUS

## 2013-04-10 MED ORDER — ESOMEPRAZOLE MAGNESIUM 40 MG PO CPDR: DELAYED_RELEASE_CAPSULE | ORAL | Status: DC

## 2013-04-10 MED ORDER — PAROXETINE HCL 10 MG PO TABS: 50.0000 mg | ORAL_TABLET | ORAL | Status: DC

## 2013-04-10 MED ORDER — LOVASTATIN 20 MG PO TABS: ORAL_TABLET | ORAL | Status: DC

## 2013-04-10 MED ORDER — TRAZODONE HCL 100 MG PO TABS: 100.0000 mg | ORAL_TABLET | Freq: Every evening | ORAL | Status: DC | PRN

## 2013-04-10 MED ORDER — LISINOPRIL 20 MG PO TABS: ORAL_TABLET | ORAL | Status: DC

## 2013-04-10 MED ORDER — AMLODIPINE BESYLATE 5 MG PO TABS: ORAL_TABLET | ORAL | Status: DC

## 2013-04-10 MED ORDER — SODIUM CHLORIDE 0.9 % IV SOLN
600.0000 mg | INTRAVENOUS | Status: DC
  Administered 2013-04-10: 600 mg via INTRAVENOUS
  Filled 2013-04-10: qty 60

## 2013-04-10 MED ORDER — LORATADINE 10 MG PO TABS: 10.0000 mg | ORAL_TABLET | Freq: Every day | ORAL | Status: DC

## 2013-04-10 NOTE — Progress Notes (Signed)
Patient ID: Jessica [name], female   DOB: Dec 22, 1955, 58 y.o.   MRN: [id number]    Subjective:   Patient ID: Jessica [name] female   DOB: 03/24/1955 58 y.o.   MRN: [id number]  HPI: [name] is a 58 y.o. woman who presents to routine f/u of chronic medical problems. Today, she is complaining of increasing panic attacks and insomnia. The patient was previously treated with Xanax, which worked well for both sleep and panic attacks. About one year ago, xanax was d/c and patient was started on buspar. The patient does nto believe that buspar has been helpful. The patients last panic attack was last Friday. She is under a lot of stress rasiing her two nieces , one of which who has sever mental health problems. This is her typical trigger for her panic. The patient is able to initiate sleep, but has difficulty staying asleep for longer than a couple of hours.     Past Medical History  Diagnosis Date  . Rheumatoid arthritis(714.0)   . Crohn's disease   . Hypertension   . Overactive bladder   . Hyperlipidemia   . Anxiety   . GERD (gastroesophageal reflux disease)   . Bronchitis    Current Outpatient Prescriptions  Medication Sig Dispense Refill  . albuterol (PROVENTIL HFA;VENTOLIN HFA) 108 (90 BASE) MCG/ACT inhaler Inhale 2 puffs into the lungs every 6 (six) hours as needed.      Marland Kitchen amLODipine (NORVASC) 5 MG tablet TAKE ONE TABLET BY MOUTH ONCE DAILY  90 tablet  0  . azaTHIOprine (IMURAN) 50 MG tablet Take 50 mg by mouth daily.      . busPIRone (BUSPAR) 15 MG tablet TAKE ONE TABLET BY MOUTH TWICE DAILY  180 tablet  1  . Calcium Carbonate-Vitamin D (CALCIUM 600 + D PO) Take 1 tablet by mouth 2 (two) times daily.      . diclofenac sodium (VOLTAREN) 1 % GEL Apply 2 g topically 4 (four) times daily as needed.  100 g  2  . doxepin (SINEQUAN) 50 MG capsule TAKE ONE CAPSULE BY MOUTH AT BEDTIME  30 capsule  0  . esomeprazole (NEXIUM) 40 MG capsule TAKE ONE CAPSULE BY MOUTH EVERY DAY  BEFORE  BREAKFAST  90 capsule  1  . Glucosamine-Chondroit-Vit C-Mn (GLUCOSAMINE CHONDR 1500 COMPLX PO) Take 1 tablet by mouth 2 (two) times daily.      Marland Kitchen lisinopril (PRINIVIL,ZESTRIL) 20 MG tablet TAKE ONE TABLET BY MOUTH AT BEDTIME  90 tablet  0  . lovastatin (MEVACOR) 20 MG tablet TAKE ONE TABLET BY MOUTH AT BEDTIME  90 tablet  0  . Mesalamine (DELZICOL) 400 MG CPDR Take 800 mg by mouth 3 (three) times daily.      . Omega-3 Fatty Acids (FISH OIL) 1000 MG CAPS Take 1,000 mg by mouth 3 (three) times daily.      Marland Kitchen oxybutynin (DITROPAN-XL) 10 MG 24 hr tablet TAKE ONE TABLET BY MOUTH ONCE DAILY  90 tablet  0  . PARoxetine (PAXIL) 40 MG tablet Take 1 tablet (40 mg total) by mouth every morning.  90 tablet  1  . PARoxetine (PAXIL) 40 MG tablet TAKE ONE TABLET BY MOUTH IN THE MORNING  30 tablet  0  . potassium chloride (K-DUR) 10 MEQ tablet Take 2 tablets (20 mEq total) by mouth daily.  30 tablet  1   No current facility-administered medications for this visit.   Facility-Administered Medications Ordered in Other Visits  Medication Dose Route Frequency  Provider Last Rate Last Dose  . 0.9 %  sodium chloride infusion   Intravenous Continuous Shirley Friar, MD 20 mL/hr at 04/10/13 1021 250 mL at 04/10/13 1021  . acetaminophen (TYLENOL) tablet 1,000 mg  1,000 mg Oral Once Shirley Friar, MD      . inFLIXimab (REMICADE) 600 mg in sodium chloride 0.9 % 250 mL infusion  600 mg Intravenous Q8 weeks Shirley Friar, MD 150 mL/hr at 04/10/13 1130 600 mg at 04/10/13 1130  . loratadine (CLARITIN) tablet 10 mg  10 mg Oral Daily Shirley Friar, MD       Family History  Problem Relation Age of Onset  . Heart disease Mother     died from pain med OD  . Cancer Paternal Grandmother     Colon cancer  . Heart disease Father   . Diabetes Maternal Grandmother    History   Social History  . Marital Status: Widowed    Spouse Name: N/A    Number of Children: N/A  . Years of Education: N/A    Social History Main Topics  . Smoking status: Former Smoker -- 1.00 packs/day for 44 years    Types: Cigarettes    Quit date: 07/30/2011  . Smokeless tobacco: None  . Alcohol Use: No  . Drug Use: No  . Sexual Activity: None   Other Topics Concern  . None   Social History Narrative  . None   Review of Systems: Review of Systems  Constitutional: Negative for fever and chills.  HENT: Negative for sore throat.   Eyes: Negative for blurred vision, double vision and photophobia.  Respiratory: Negative for cough, sputum production and shortness of breath.   Cardiovascular: Negative for chest pain, palpitations, orthopnea, leg swelling and PND.  Gastrointestinal: Positive for heartburn. Negative for nausea, vomiting, abdominal pain and diarrhea.  Neurological: Negative for weakness and headaches.  Psychiatric/Behavioral: Negative for depression and suicidal ideas. The patient is nervous/anxious.     Objective:  Physical Exam: Filed Vitals:   04/10/13 1321  BP: 124/69  Pulse: 58  Temp: 98.3 F (36.8 C)  TempSrc: Oral  Height: 5\' 7"  (1.702 m)  Weight: 260 lb 12.8 oz (118.298 kg)  SpO2: 96%   Physical Exam  Constitutional: She is oriented to person, place, and time. She appears well-developed and well-nourished.  Obese female.  HENT:  Head: Normocephalic.  Mouth/Throat: Oropharynx is clear and moist. No oropharyngeal exudate.  Cardiovascular: Normal rate, regular rhythm, normal heart sounds and intact distal pulses.  Exam reveals no friction rub.   No murmur heard. Pulmonary/Chest: Effort normal and breath sounds normal. No respiratory distress. She has no wheezes. She has no rales.  Abdominal: Soft. Bowel sounds are normal. She exhibits no distension. There is no tenderness. There is no rebound and no guarding.  Musculoskeletal: She exhibits no edema and no tenderness.  Neurological: She is alert and oriented to person, place, and time.  Psychiatric:  Anxious mood and  affect.    Assessment & Plan:

## 2013-04-10 NOTE — Patient Instructions (Signed)
Please increase your paxil to 50 mg per day. If you have continued panic attacks by the end of one week, please increase to 60 mg per day.  Please start taking trazodone 100 mg each night as needed for sleep.  Please follow up with our clinic in 4 weeks.  I am checking lab work today. I will call you if there are abnormal results.

## 2013-04-11 DIAGNOSIS — G4700 Insomnia, unspecified: Secondary | ICD-10-CM | POA: Insufficient documentation

## 2013-04-11 LAB — LIPID PANEL
CHOL/HDL RATIO: 3.8 ratio
Cholesterol: 148 mg/dL (ref 0–200)
HDL: 39 mg/dL — AB (ref >39)
LDL Cholesterol: 78 mg/dL (ref 0–99)
TRIGLYCERIDES: 156 mg/dL — AB (ref <150)
VLDL: 31 mg/dL (ref 0–40)

## 2013-04-11 NOTE — Assessment & Plan Note (Signed)
BP Readings from Last 3 Encounters:  04/10/13 124/69  04/10/13 138/71  02/10/13 119/61    Lab Results  Component Value Date   NA 141 04/10/2013   K 4.1 04/10/2013   CREATININE 1.02 04/10/2013    Assessment: Blood pressure control:   At goal Comments:BP well controlled on current meds. Tolerating meds w/o problems.  Plan: Medications:  continue current medications Educational resources provided: brochure Self management tools provided: home blood pressure logbook Other plans: Checking BMP today.  BMET    Component Value Date/Time   NA 141 04/10/2013 1413   K 4.1 04/10/2013 1413   CL 102 04/10/2013 1413   CO2 29 04/10/2013 1413   GLUCOSE 90 04/10/2013 1413   BUN 8 04/10/2013 1413   CREATININE 1.02 04/10/2013 1413   CREATININE 0.65 09/02/2011 0625   CALCIUM 9.2 04/10/2013 1413   GFRNONAA 61 04/10/2013 1413   GFRNONAA >90 09/02/2011 0625   GFRAA 70 04/10/2013 1413   GFRAA >90 09/02/2011 0625    BMP with WNL.

## 2013-04-11 NOTE — Assessment & Plan Note (Signed)
Appears stable. No complaints at this time. Patient requests refill on inhaler (albuterol). Plan to continue current management with albuterol prn.

## 2013-04-11 NOTE — Assessment & Plan Note (Signed)
Patient requested xanax to help with sleep. I suggested improved sleep hygiene. Notabaly, the patient consumes large amounts of tea throughout the day. I instructed the patient to stop drinking caffeinated beverages after noon. Further, I recommended trying 100 mg trazodone qhs prn for sleep. The patient is in agreement with this plan. Will f.u in 1 month.

## 2013-04-11 NOTE — Assessment & Plan Note (Signed)
Patient appears to have worsening panic d/o since d/c xanax 1 year ago. I spoke at length about the dangers of short acting BZDs like Xanax. We decided to try increasing the patient paxil to 50 mg qd and then to 60 mg qd if her symptoms are not improved after one week. Plan to f/u with patient in 4 weeks to reassess panic d/o. May need to consider psych referral if patient requires long term BZD therapy.

## 2013-04-11 NOTE — Assessment & Plan Note (Signed)
Patient receives Remicade q 8 wks. She is having no symptoms at this time. She had her injection earlier in the day. Plan for her to continue management by GI.

## 2013-04-11 NOTE — Assessment & Plan Note (Signed)
Appears stable, rechecked CBC.  CBC    Component Value Date/Time   WBC 6.1 04/10/2013 1413   RBC 4.17 04/10/2013 1413   RBC 3.61* 09/01/2011 0843   HGB 12.4 04/10/2013 1413   HCT 37.3 04/10/2013 1413   PLT 317 04/10/2013 1413   MCV 89.4 04/10/2013 1413   MCH 29.7 04/10/2013 1413   MCHC 33.2 04/10/2013 1413   RDW 15.1 04/10/2013 1413   LYMPHSABS 2.5 04/10/2013 1413   MONOABS 0.4 04/10/2013 1413   EOSABS 0.1 04/10/2013 1413   BASOSABS 0.0 04/10/2013 1413    Patients H/H is WNL.

## 2013-04-11 NOTE — Progress Notes (Signed)
Case discussed with Dr. Komanski at time of visit.  We reviewed the resident's history and exam and pertinent patient test results.  I agree with the assessment, diagnosis, and plan of care documented in the resident's note. 

## 2013-05-08 ENCOUNTER — Ambulatory Visit (INDEPENDENT_AMBULATORY_CARE_PROVIDER_SITE_OTHER): Payer: Medicare Other | Admitting: Internal Medicine

## 2013-05-08 ENCOUNTER — Encounter: Payer: Self-pay | Admitting: Internal Medicine

## 2013-05-08 VITALS — BP 144/83 | HR 61 | Temp 98.1°F | Ht 66.0 in | Wt 257.2 lb

## 2013-05-08 DIAGNOSIS — G4700 Insomnia, unspecified: Secondary | ICD-10-CM

## 2013-05-08 DIAGNOSIS — F410 Panic disorder [episodic paroxysmal anxiety] without agoraphobia: Secondary | ICD-10-CM

## 2013-05-08 DIAGNOSIS — N3946 Mixed incontinence: Secondary | ICD-10-CM | POA: Insufficient documentation

## 2013-05-08 DIAGNOSIS — F411 Generalized anxiety disorder: Secondary | ICD-10-CM

## 2013-05-08 MED ORDER — PAROXETINE HCL 30 MG PO TABS: 60.0000 mg | ORAL_TABLET | ORAL | Status: DC

## 2013-05-08 MED ORDER — OXYBUTYNIN CHLORIDE ER 15 MG PO TB24
ORAL_TABLET | ORAL | Status: DC
Start: 2013-05-08 — End: 2013-07-11

## 2013-05-08 MED ORDER — CETIRIZINE HCL 10 MG PO TBDP: 10.0000 mg | ORAL_TABLET | Freq: Every day | ORAL | Status: DC

## 2013-05-08 MED ORDER — TRAZODONE HCL 100 MG PO TABS: 100.0000 mg | ORAL_TABLET | Freq: Every evening | ORAL | Status: DC | PRN

## 2013-05-08 NOTE — Patient Instructions (Signed)
General Instructions: For your Urinary Incontinence: -we are increasing your Oxybutynin to 15 mg once daily.  If, after 2 weeks, this has not improved your symptoms, you may increase your dose to 20 mg once daily  For your Anxiety and difficulty sleeping: -we have refilled your Paxil (now just 2 tabs/day, still a total of 60 mg daily) and trazodone  For your allergies: -I have prescribed Cetirizine (Zyrtec) lozenges, prescription strength  Please return for a follow-up visit in 4 weeks.   Treatment Goals:  Goals (1 Years of Data) as of 05/08/13         As of Today 04/10/13 04/10/13 04/10/13 04/10/13     Blood Pressure    . Blood Pressure < 140/90  144/83 124/69 138/71 127/81 120/74      Progress Toward Treatment Goals:  Treatment Goal 05/08/2013  Blood pressure unchanged    Self Care Goals & Plans:  Self Care Goal 04/10/2013  Manage my medications take my medicines as prescribed; bring my medications to every visit; refill my medications on time  Monitor my health -  Eat healthy foods drink diet soda or water instead of juice or soda; eat more vegetables; eat foods that are low in salt; eat baked foods instead of fried foods; eat fruit for snacks and desserts  Be physically active -    No flowsheet data found.   Care Management & Community Referrals:  Referral 05/08/2013  Referrals made for care management support none needed  Referrals made to community resources -

## 2013-05-08 NOTE — Progress Notes (Signed)
Case discussed with Dr. Brown at the time of the visit.  We reviewed the resident's history and exam and pertinent patient test results.  I agree with the assessment, diagnosis, and plan of care documented in the resident's note. 

## 2013-05-08 NOTE — Assessment & Plan Note (Signed)
Symptoms improved with trazodone. - continue trazodone

## 2013-05-08 NOTE — Assessment & Plan Note (Signed)
Symptoms have improved since increasing Paxil from 40 to 60 mg, at her last visit, now with panic attacks only once a month, improved from once a week. -Continue Paxil 60 mg daily

## 2013-05-08 NOTE — Progress Notes (Signed)
HPI The patient is a 58 y.o. female with a history of HTN, Crohn's, panic disorder, presenting for a follow-up visit, with concerns about urge urinary incontinence.  The patient has a history of feeling the "urge" to go to the bathroom, and having to run to the backroom to avoid incontinence, sometimes with small volume incontinence while on the way to the bathroom.  She also notes occasional leakage of urine when coughing/laughing, but notes more concern about her urge incontinence.  She notes going to the bathroom about every 3-4 hours.  Symptoms have been present for years.  She has been on oxybutynin for about 1 year, but hasn't noted much benefit.  She notes drinking 4 caffeinated drinks/day.  The patient has a history of panic disorder.  The patient has uptitrated her paxil to 60 mg daily, as instructed at her last visit.  She has now only had 1 panic attack in the last month, improved from once/week.  She has been taking trazodone, which has helped her get some sleep.  She asks for a refill.  The patient notes seasonal allergies, and asks for something prescription-strength to help with her allergies.  ROS: General: no fevers, chills, changes in weight, changes in appetite Skin: no rash HEENT: +mild rhinorrhea, no blurry vision, hearing changes, sore throat Pulm: no dyspnea, coughing, wheezing CV: no chest pain, palpitations, shortness of breath Abd: no abdominal pain, nausea/vomiting, diarrhea/constipation GU: see HPI Ext: no arthralgias, myalgias Neuro: no weakness, numbness, or tingling  Filed Vitals:   05/08/13 0921  BP: 144/83  Pulse: 61  Temp: 98.1 F (36.7 C)    PEX General: alert, cooperative, and in no apparent distress HEENT: pupils equal round and reactive to light, vision grossly intact, oropharynx clear and non-erythematous  Neck: supple Lungs: clear to ascultation bilaterally, normal work of respiration, no wheezes, rales, ronchi Heart: regular rate and rhythm,  no murmurs, gallops, or rubs Abdomen: soft, non-tender, non-distended, normal bowel sounds Extremities: no cyanosis, clubbing, or edema Neurologic: alert & oriented X3, cranial nerves II-XII intact, strength grossly intact, sensation intact to light touch  Current Outpatient Prescriptions on File Prior to Visit  Medication Sig Dispense Refill  . albuterol (PROVENTIL HFA;VENTOLIN HFA) 108 (90 BASE) MCG/ACT inhaler Inhale 2 puffs into the lungs every 6 (six) hours as needed.  1 Inhaler  6  . amLODipine (NORVASC) 5 MG tablet TAKE ONE TABLET BY MOUTH ONCE DAILY  90 tablet  2  . busPIRone (BUSPAR) 15 MG tablet TAKE ONE TABLET BY MOUTH TWICE DAILY  180 tablet  1  . Calcium Carbonate-Vitamin D (CALCIUM 600 + D PO) Take 1 tablet by mouth 2 (two) times daily.      . diclofenac sodium (VOLTAREN) 1 % GEL Apply 2 g topically 4 (four) times daily as needed.  100 g  2  . esomeprazole (NEXIUM) 40 MG capsule TAKE ONE CAPSULE BY MOUTH EVERY DAY BEFORE  BREAKFAST  90 capsule  1  . lisinopril (PRINIVIL,ZESTRIL) 20 MG tablet TAKE ONE TABLET BY MOUTH AT BEDTIME  90 tablet  1  . lovastatin (MEVACOR) 20 MG tablet TAKE ONE TABLET BY MOUTH AT BEDTIME  90 tablet  3  . Mesalamine (DELZICOL) 400 MG CPDR Take 800 mg by mouth 3 (three) times daily.      Marland Kitchen oxybutynin (DITROPAN-XL) 10 MG 24 hr tablet TAKE ONE TABLET BY MOUTH ONCE DAILY  90 tablet  2  . PARoxetine (PAXIL) 10 MG tablet Take 5 tablets (50 mg total) by  mouth every morning.  90 tablet  1  . traZODone (DESYREL) 100 MG tablet Take 1 tablet (100 mg total) by mouth at bedtime as needed for sleep.  30 tablet  0   No current facility-administered medications on file prior to visit.    Assessment/Plan

## 2013-05-08 NOTE — Assessment & Plan Note (Signed)
The patient has history of both urge and stress urinary incontinence, though notes more concerned about her urge urinary incontinence. She has been on oxybutynin XR 10 mg for about one year, without much benefit, though she admits she does not often bring up this complaint at her visits. -Uptitrate oxybutynin to 15 mg XR daily, then instructed patient she can increase it to 20 mg XR daily if no improvement in symptoms after 2 weeks -Follow up in 4 weeks to assess response, and can continue to uptitrating to the max dose of 30 mg daily if needed, or can switch to different medication

## 2013-06-05 ENCOUNTER — Encounter (HOSPITAL_COMMUNITY)
Admission: RE | Admit: 2013-06-05 | Discharge: 2013-06-05 | Disposition: A | Discharge status: Home / Self Care | Payer: Medicare Other | Source: Ambulatory Visit | Attending: Gastroenterology | Admitting: Gastroenterology

## 2013-06-05 DIAGNOSIS — K5090 Crohn's disease, unspecified, without complications: Secondary | ICD-10-CM | POA: Insufficient documentation

## 2013-06-05 MED ORDER — SODIUM CHLORIDE 0.9 % IV SOLN
600.0000 mg | INTRAVENOUS | Status: DC
  Administered 2013-06-05: 600 mg via INTRAVENOUS
  Filled 2013-06-05: qty 60

## 2013-06-05 MED ORDER — LORATADINE 10 MG PO TABS: 10.0000 mg | ORAL_TABLET | Freq: Every day | ORAL | Status: DC

## 2013-06-05 MED ORDER — ACETAMINOPHEN 500 MG PO TABS: 1000.0000 mg | ORAL_TABLET | Freq: Once | ORAL | Status: DC

## 2013-06-05 MED ORDER — SODIUM CHLORIDE 0.9 % IV SOLN
INTRAVENOUS | Status: DC
  Administered 2013-06-05: 10:00:00 via INTRAVENOUS

## 2013-06-07 ENCOUNTER — Encounter: Payer: Medicare Other | Admitting: Internal Medicine

## 2013-06-08 ENCOUNTER — Other Ambulatory Visit: Payer: Self-pay | Admitting: Internal Medicine

## 2013-07-11 ENCOUNTER — Other Ambulatory Visit: Payer: Self-pay | Admitting: Internal Medicine

## 2013-07-31 ENCOUNTER — Encounter (HOSPITAL_COMMUNITY)
Admission: RE | Admit: 2013-07-31 | Discharge: 2013-07-31 | Disposition: A | Discharge status: Home / Self Care | Payer: Medicare Other | Source: Ambulatory Visit | Attending: Gastroenterology | Admitting: Gastroenterology

## 2013-07-31 DIAGNOSIS — K5090 Crohn's disease, unspecified, without complications: Secondary | ICD-10-CM | POA: Diagnosis not present

## 2013-07-31 MED ORDER — ACETAMINOPHEN 500 MG PO TABS: 1000.0000 mg | ORAL_TABLET | ORAL | Status: DC

## 2013-07-31 MED ORDER — LORATADINE 10 MG PO TABS: 10.0000 mg | ORAL_TABLET | ORAL | Status: DC

## 2013-07-31 MED ORDER — LORATADINE 10 MG PO TABS
ORAL_TABLET | ORAL | Status: AC
  Administered 2013-07-31: 10 mg
  Filled 2013-07-31: qty 1

## 2013-07-31 MED ORDER — ACETAMINOPHEN 500 MG PO TABS
ORAL_TABLET | ORAL | Status: AC
  Administered 2013-07-31: 1000 mg
  Filled 2013-07-31: qty 2

## 2013-07-31 MED ORDER — SODIUM CHLORIDE 0.9 % IV SOLN
600.0000 mg | INTRAVENOUS | Status: DC
  Administered 2013-07-31: 600 mg via INTRAVENOUS
  Filled 2013-07-31: qty 60

## 2013-07-31 MED ORDER — SODIUM CHLORIDE 0.9 % IV SOLN
INTRAVENOUS | Status: DC
  Administered 2013-07-31: 09:00:00 via INTRAVENOUS

## 2013-08-07 ENCOUNTER — Other Ambulatory Visit: Payer: Self-pay | Admitting: Internal Medicine

## 2013-09-05 ENCOUNTER — Other Ambulatory Visit: Payer: Self-pay | Admitting: Internal Medicine

## 2013-09-05 NOTE — Telephone Encounter (Signed)
Pls sch appt PCP next 90 days. 

## 2013-09-05 NOTE — Telephone Encounter (Signed)
Message about appt with PCP sent to front desk pool.

## 2013-09-15 ENCOUNTER — Other Ambulatory Visit: Payer: Self-pay | Admitting: *Deleted

## 2013-09-15 MED ORDER — BUSPIRONE HCL 15 MG PO TABS: 15.0000 mg | ORAL_TABLET | Freq: Two times a day (BID) | ORAL | Status: DC

## 2013-09-20 ENCOUNTER — Other Ambulatory Visit: Payer: Self-pay | Admitting: *Deleted

## 2013-09-20 NOTE — Telephone Encounter (Signed)
Pharm calls and states buspirone was filled for #30 should have been #60, could we send another #30 for this month to cover pt taking twice daily?

## 2013-09-21 ENCOUNTER — Other Ambulatory Visit (HOSPITAL_COMMUNITY): Payer: Self-pay | Admitting: *Deleted

## 2013-09-22 ENCOUNTER — Encounter (HOSPITAL_COMMUNITY)
Admission: RE | Admit: 2013-09-22 | Discharge: 2013-09-22 | Disposition: A | Discharge status: Home / Self Care | Payer: Medicare Other | Source: Ambulatory Visit | Attending: Gastroenterology | Admitting: Gastroenterology

## 2013-09-22 DIAGNOSIS — K5090 Crohn's disease, unspecified, without complications: Secondary | ICD-10-CM | POA: Insufficient documentation

## 2013-09-22 MED ORDER — SODIUM CHLORIDE 0.9 % IV SOLN
INTRAVENOUS | Status: DC
  Administered 2013-09-22: 250 mL via INTRAVENOUS

## 2013-09-22 MED ORDER — LORATADINE 10 MG PO TABS
10.0000 mg | ORAL_TABLET | ORAL | Status: DC
  Administered 2013-09-22: 10 mg via ORAL

## 2013-09-22 MED ORDER — SODIUM CHLORIDE 0.9 % IV SOLN
600.0000 mg | INTRAVENOUS | Status: DC
  Administered 2013-09-22: 600 mg via INTRAVENOUS
  Filled 2013-09-22: qty 60

## 2013-09-22 MED ORDER — ACETAMINOPHEN 500 MG PO TABS
ORAL_TABLET | ORAL | Status: AC
  Filled 2013-09-22: qty 2

## 2013-09-22 MED ORDER — ACETAMINOPHEN 500 MG PO TABS
1000.0000 mg | ORAL_TABLET | ORAL | Status: DC
  Administered 2013-09-22: 1000 mg via ORAL

## 2013-09-22 MED ORDER — LORATADINE 10 MG PO TABS
ORAL_TABLET | ORAL | Status: AC
  Filled 2013-09-22: qty 1

## 2013-09-25 ENCOUNTER — Encounter (HOSPITAL_COMMUNITY): Payer: Medicare Other

## 2013-09-28 ENCOUNTER — Other Ambulatory Visit: Payer: Self-pay | Admitting: Oncology

## 2013-09-28 ENCOUNTER — Other Ambulatory Visit: Payer: Self-pay | Admitting: *Deleted

## 2013-09-28 MED ORDER — OXYBUTYNIN CHLORIDE ER 15 MG PO TB24: 15.0000 mg | ORAL_TABLET | Freq: Every day | ORAL | Status: DC

## 2013-09-28 MED ORDER — BUSPIRONE HCL 15 MG PO TABS: 15.0000 mg | ORAL_TABLET | Freq: Two times a day (BID) | ORAL | Status: DC

## 2013-09-28 NOTE — Telephone Encounter (Signed)
Last refill for Buspar was only for # 30 and a months supply is # 60.  Pt is almost out so can you refill for correct number and also give a 3 month supply?

## 2013-11-13 ENCOUNTER — Other Ambulatory Visit: Payer: Self-pay | Admitting: *Deleted

## 2013-11-13 NOTE — Telephone Encounter (Signed)
States she has been out of med for several days. Thanks

## 2013-11-14 MED ORDER — LISINOPRIL 20 MG PO TABS: ORAL_TABLET | ORAL | Status: DC

## 2013-11-15 ENCOUNTER — Encounter (HOSPITAL_COMMUNITY)
Admission: RE | Admit: 2013-11-15 | Discharge: 2013-11-15 | Disposition: A | Discharge status: Home / Self Care | Payer: Medicare Other | Source: Ambulatory Visit | Attending: Gastroenterology | Admitting: Gastroenterology

## 2013-11-15 DIAGNOSIS — K50919 Crohn's disease, unspecified, with unspecified complications: Secondary | ICD-10-CM | POA: Insufficient documentation

## 2013-11-15 MED ORDER — LORATADINE 10 MG PO TABS
ORAL_TABLET | ORAL | Status: AC
  Administered 2013-11-15: 10 mg
  Filled 2013-11-15: qty 1

## 2013-11-15 MED ORDER — SODIUM CHLORIDE 0.9 % IV SOLN
INTRAVENOUS | Status: DC
  Administered 2013-11-15: 10:00:00 via INTRAVENOUS

## 2013-11-15 MED ORDER — LORATADINE 10 MG PO TABS: 10.0000 mg | ORAL_TABLET | ORAL | Status: DC

## 2013-11-15 MED ORDER — ACETAMINOPHEN 500 MG PO TABS: 1000.0000 mg | ORAL_TABLET | ORAL | Status: DC

## 2013-11-15 MED ORDER — SODIUM CHLORIDE 0.9 % IV SOLN
600.0000 mg | INTRAVENOUS | Status: DC
  Administered 2013-11-15: 600 mg via INTRAVENOUS
  Filled 2013-11-15: qty 60

## 2013-11-17 ENCOUNTER — Encounter (HOSPITAL_COMMUNITY): Payer: Medicare Other

## 2013-11-21 ENCOUNTER — Other Ambulatory Visit: Payer: Self-pay | Admitting: *Deleted

## 2013-11-21 DIAGNOSIS — F411 Generalized anxiety disorder: Secondary | ICD-10-CM

## 2013-11-21 MED ORDER — TRAZODONE HCL 100 MG PO TABS: 100.0000 mg | ORAL_TABLET | Freq: Every evening | ORAL | Status: DC | PRN

## 2013-11-21 MED ORDER — PAROXETINE HCL 30 MG PO TABS: 60.0000 mg | ORAL_TABLET | ORAL | Status: DC

## 2013-12-08 ENCOUNTER — Encounter: Payer: Self-pay | Admitting: *Deleted

## 2014-01-08 ENCOUNTER — Encounter: Payer: Medicare Other | Admitting: Internal Medicine

## 2014-01-10 ENCOUNTER — Encounter (HOSPITAL_COMMUNITY)
Admission: RE | Admit: 2014-01-10 | Discharge: 2014-01-10 | Disposition: A | Discharge status: Home / Self Care | Payer: Medicare Other | Source: Ambulatory Visit | Attending: Gastroenterology | Admitting: Gastroenterology

## 2014-01-10 DIAGNOSIS — K5090 Crohn's disease, unspecified, without complications: Secondary | ICD-10-CM | POA: Insufficient documentation

## 2014-01-10 MED ORDER — SODIUM CHLORIDE 0.9 % IV SOLN
600.0000 mg | INTRAVENOUS | Status: DC
  Administered 2014-01-10: 600 mg via INTRAVENOUS
  Filled 2014-01-10: qty 60

## 2014-01-10 MED ORDER — ACETAMINOPHEN 500 MG PO TABS
1000.0000 mg | ORAL_TABLET | ORAL | Status: DC
  Administered 2014-01-10: 1000 mg via ORAL

## 2014-01-10 MED ORDER — ACETAMINOPHEN 500 MG PO TABS
ORAL_TABLET | ORAL | Status: AC
  Filled 2014-01-10: qty 2

## 2014-01-10 MED ORDER — SODIUM CHLORIDE 0.9 % IV SOLN
INTRAVENOUS | Status: DC
  Administered 2014-01-10: 09:00:00 via INTRAVENOUS

## 2014-01-10 MED ORDER — LORATADINE 10 MG PO TABS
ORAL_TABLET | ORAL | Status: AC
  Filled 2014-01-10: qty 1

## 2014-01-10 MED ORDER — LORATADINE 10 MG PO TABS
10.0000 mg | ORAL_TABLET | ORAL | Status: DC
  Administered 2014-01-10: 10 mg via ORAL

## 2014-01-11 ENCOUNTER — Other Ambulatory Visit: Payer: Self-pay | Admitting: Internal Medicine

## 2014-01-15 ENCOUNTER — Encounter: Payer: Self-pay | Admitting: Internal Medicine

## 2014-01-15 ENCOUNTER — Ambulatory Visit (INDEPENDENT_AMBULATORY_CARE_PROVIDER_SITE_OTHER): Payer: Medicare Other | Admitting: Internal Medicine

## 2014-01-15 VITALS — BP 132/66 | HR 60 | Temp 98.2°F | Ht 66.5 in | Wt 227.3 lb

## 2014-01-15 DIAGNOSIS — Z0000 Encounter for general adult medical examination without abnormal findings: Secondary | ICD-10-CM | POA: Insufficient documentation

## 2014-01-15 DIAGNOSIS — J019 Acute sinusitis, unspecified: Secondary | ICD-10-CM

## 2014-01-15 DIAGNOSIS — M159 Polyosteoarthritis, unspecified: Secondary | ICD-10-CM

## 2014-01-15 DIAGNOSIS — I10 Essential (primary) hypertension: Secondary | ICD-10-CM

## 2014-01-15 DIAGNOSIS — M8949 Other hypertrophic osteoarthropathy, multiple sites: Secondary | ICD-10-CM

## 2014-01-15 DIAGNOSIS — J329 Chronic sinusitis, unspecified: Secondary | ICD-10-CM | POA: Insufficient documentation

## 2014-01-15 DIAGNOSIS — K5090 Crohn's disease, unspecified, without complications: Secondary | ICD-10-CM

## 2014-01-15 DIAGNOSIS — M150 Primary generalized (osteo)arthritis: Secondary | ICD-10-CM

## 2014-01-15 MED ORDER — LEVOFLOXACIN 750 MG PO TABS: 750.0000 mg | ORAL_TABLET | Freq: Every day | ORAL | Status: AC

## 2014-01-15 NOTE — Patient Instructions (Addendum)
It seems that a bacterial sinusitis may be causing your symptoms. I have prescribed levofloxacin 750 mg daily for 7 days to treat this. Please schedule a return to clinic should your symptoms not improve in the next 4-5 days. Please follow-up with your GI specialist for your Crohn's disease.  General Instructions:   Please bring your medicines with you each time you come to clinic.  Medicines may include prescription medications, over-the-counter medications, herbal remedies, eye drops, vitamins, or other pills.   Progress Toward Treatment Goals:  Treatment Goal 05/08/2013  Blood pressure unchanged    Self Care Goals & Plans:  Self Care Goal 01/15/2014  Manage my medications take my medicines as prescribed; bring my medications to every visit; refill my medications on time  Monitor my health -  Eat healthy foods drink diet soda or water instead of juice or soda; eat more vegetables; eat foods that are low in salt; eat baked foods instead of fried foods; eat fruit for snacks and desserts  Be physically active -    No flowsheet data found.   Care Management & Community Referrals:  Referral 05/08/2013  Referrals made for care management support none needed  Referrals made to community resources -

## 2014-01-15 NOTE — Assessment & Plan Note (Signed)
Patient continues with Remicade treatment. Currently asymptomatic. Will continue management per GI.

## 2014-01-15 NOTE — Assessment & Plan Note (Signed)
Patient reports that she has been having a productive cough of green and yellow sputum she states that her sister had been sick with similar symptoms at that time. She states that she has had the sensation of post nasal drip and that these symptoms are worse and at night. Patient denies any associated rhinorrhea or sore throat at this point. Patient also denies any burning sensation in her throat, sour taste in her mouth, myalgias, hemoptysis. At this point, patient has had symptoms for over 2 1/2 weeks. Additionally, patient is at higher risk given her infliximab treatment for her Crohn's disease, warranting antibiotic treatment. Patient has a penicillin allergy making first-line therapies not ideal. -levofloxacin 750 mg daily for seven days with a planned end date of January 22, 2014.

## 2014-01-15 NOTE — Assessment & Plan Note (Signed)
Patient reporting pain throughout her back, bilateral knees. She states that she only occasionally takes Tylenol and does not take much ibuprofen for it because it irritates her stomach. Patient states that the voltaren gel works a little bit but not optimally. -continue with Tylenol at this point. May consider escalation in the future should symptoms not resolve

## 2014-01-15 NOTE — Assessment & Plan Note (Signed)
BP Readings from Last 3 Encounters:  01/15/14 132/66  01/10/14 112/54  11/15/13 113/63    Lab Results  Component Value Date   NA 141 04/10/2013   K 4.1 04/10/2013   CREATININE 1.02 04/10/2013    Assessment: Blood pressure control: controlled Progress toward BP goal:  at goal Comments: Compliant with medications. Blood pressure continue to be well controlled.   Plan: Medications:  continue current medications Educational resources provided: brochure (denies) Self management tools provided:   Other plans: plan for BMET at next visit

## 2014-01-15 NOTE — Progress Notes (Signed)
Internal Medicine Clinic Attending  Case discussed with Dr. Ngo at the time of the visit.  We reviewed the resident's history and exam and pertinent patient test results.  I agree with the assessment, diagnosis, and plan of care documented in the resident's note. 

## 2014-01-15 NOTE — Progress Notes (Signed)
   Subjective:    Patient ID: Jessica [name], female    DOB: 1955-01-25, 59 y.o.   MRN: [id number]  HPI  Patient is a 59 year old with history of hypertension, GERD, hyperlipidemia, depression, Crohn's disease, osteoarthritis, panic disorder who presents to clinic for an annual exam as well as cough and back pain.   Please refer to separate problem-list charting for more details.  Review of Systems  Constitutional: Negative for fever and chills.  HENT: Negative for rhinorrhea and sore throat.   Eyes: Negative for visual disturbance.  Respiratory: Positive for cough. Negative for shortness of breath.   Cardiovascular: Negative for chest pain and palpitations.  Gastrointestinal: Negative for nausea, vomiting, abdominal pain, diarrhea, constipation and blood in stool.  Genitourinary: Negative for dysuria and hematuria.  Neurological: Negative for syncope.       Objective:   Physical Exam  Constitutional: She is oriented to person, place, and time. She appears well-developed and well-nourished. No distress.  HENT:  Head: Normocephalic and atraumatic.  Eyes: EOM are normal. Pupils are equal, round, and reactive to light. Left eye exhibits no discharge.  Neck: Normal range of motion. Neck supple. No thyromegaly present.  Cardiovascular: Normal rate and regular rhythm.  Exam reveals no gallop and no friction rub.   No murmur heard. Pulmonary/Chest: Effort normal and breath sounds normal. No respiratory distress. She has no wheezes. She has no rales.  Abdominal: Soft. Bowel sounds are normal. She exhibits no distension. There is no tenderness. There is no rebound.  Musculoskeletal: She exhibits no edema.  Neurological: She is alert and oriented to person, place, and time. No cranial nerve deficit.  Skin: Skin is warm and dry. No rash noted.  Psychiatric: She has a normal mood and affect. Thought content normal.      Assessment & Plan:  Please refer to separate problem-list charting  for more details.

## 2014-02-03 IMAGING — CT CT MAXILLOFACIAL W/O CM
3 series · 16 of 47 positions shown, 19 images · non-contrast
Comparison: None

CLINICAL DATA: Cough.  Stuffiness.  Rule out sinusitis.

CT MAXILLOFACIAL WITHOUT CONTRAST
TECHNIQUE: Multidetector CT imaging of the maxillofacial
structures was performed. Multiplanar CT image reconstructions were
also generated.

[Series 2: facial bones · axial · 0.39mm/px · z∈[+36,+160]mm · 10 of 74 slices shown, 13 images]
[im 6/74  brain]
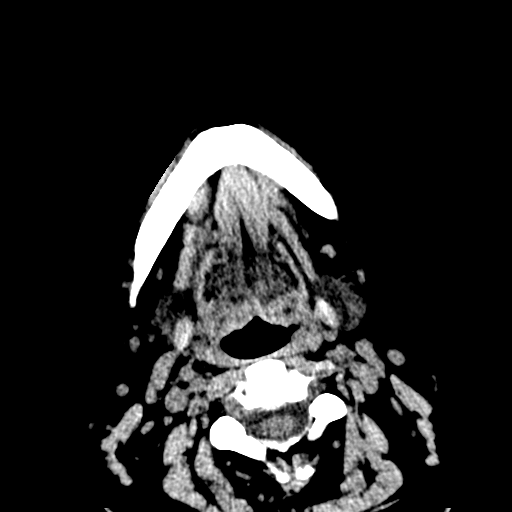
[im 6/74  bone]
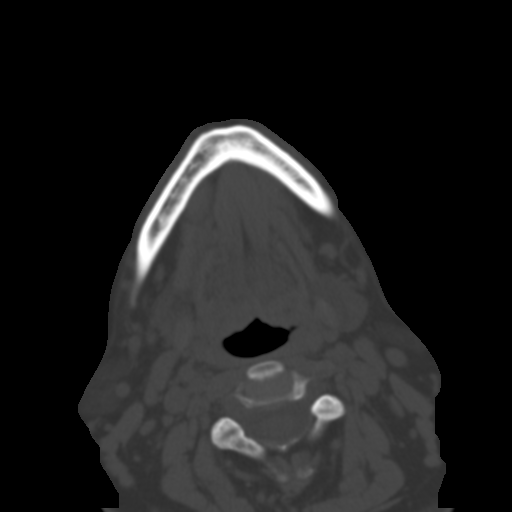
[im 13/74  bone]
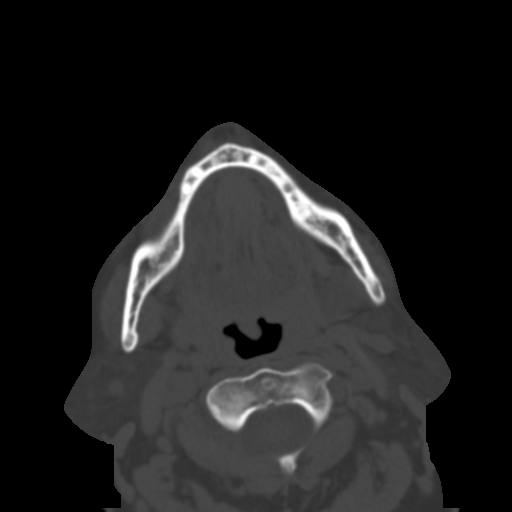
[im 21/74  bone]
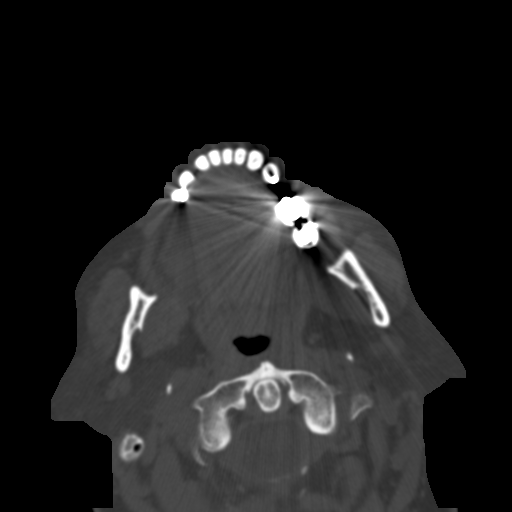
[im 26/74  bone]
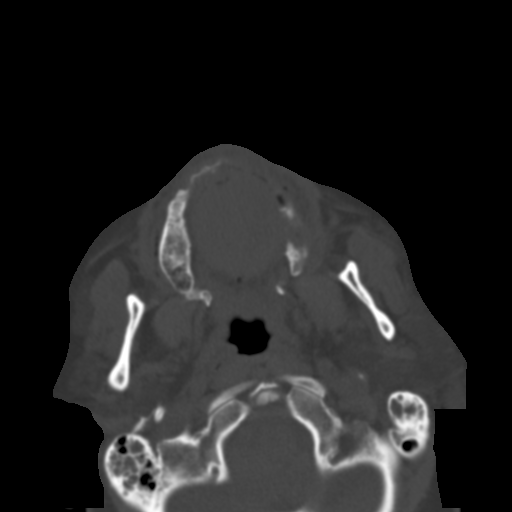
[im 33/74  brain]
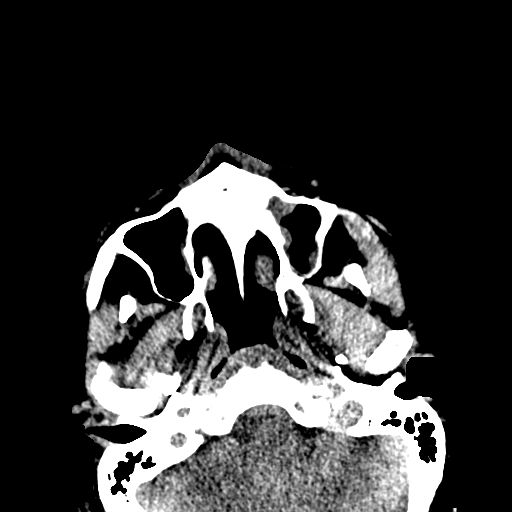
[im 33/74  bone]
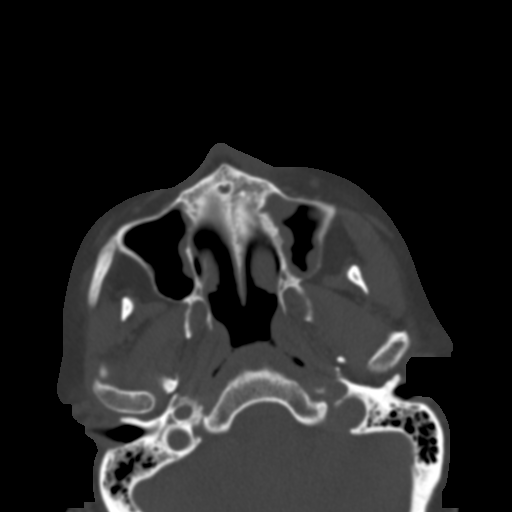
[im 41/74  bone]
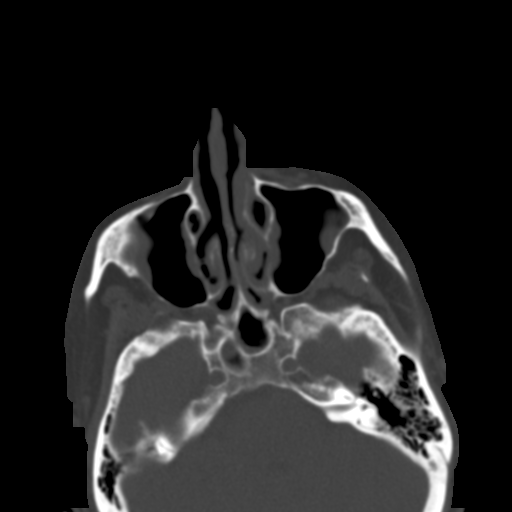
[im 48/74  bone]
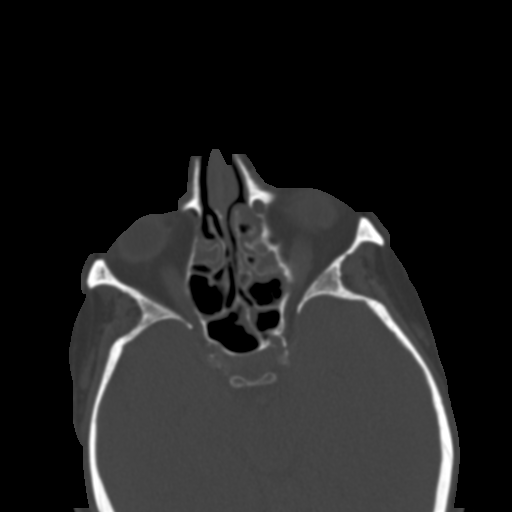
[im 56/74  bone]
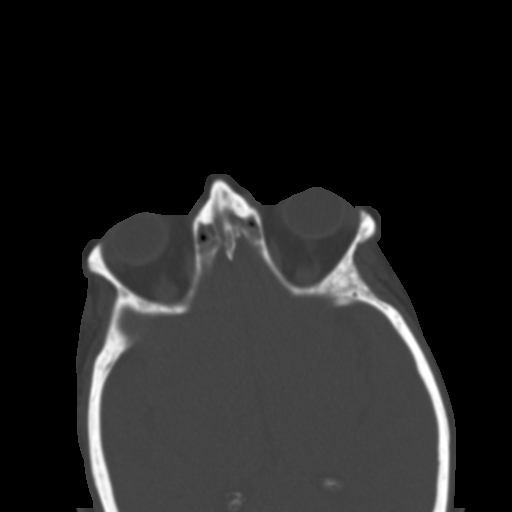
[im 61/74  brain]
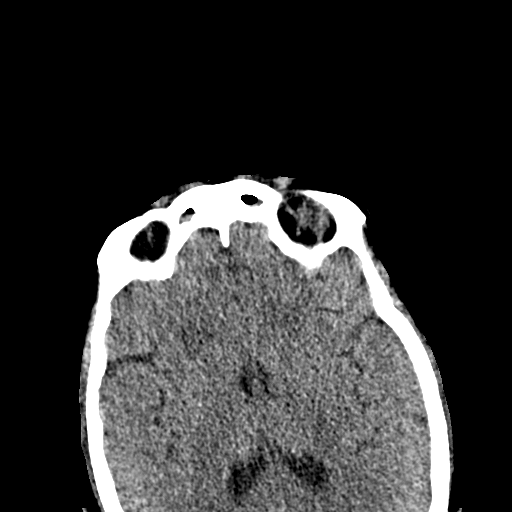
[im 61/74  bone]
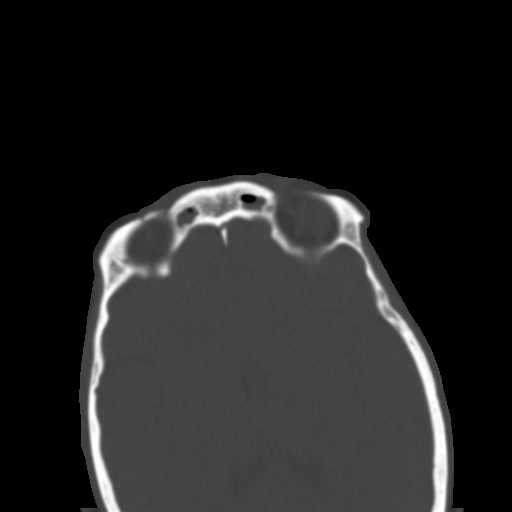
[im 68/74  bone]
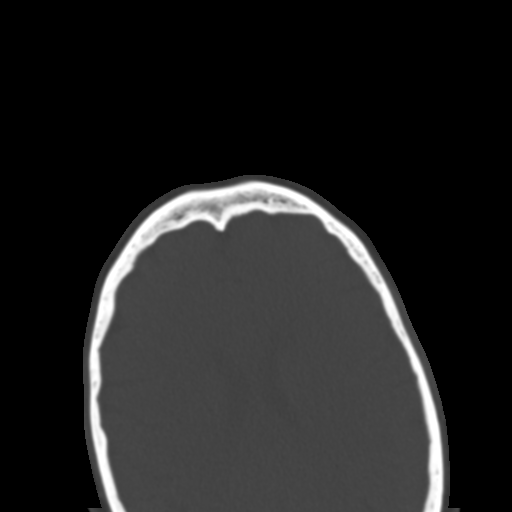

[sta coronals · coronal · 0.41mm/px · 3 of 74 slices shown]
[im 25/74  bone]
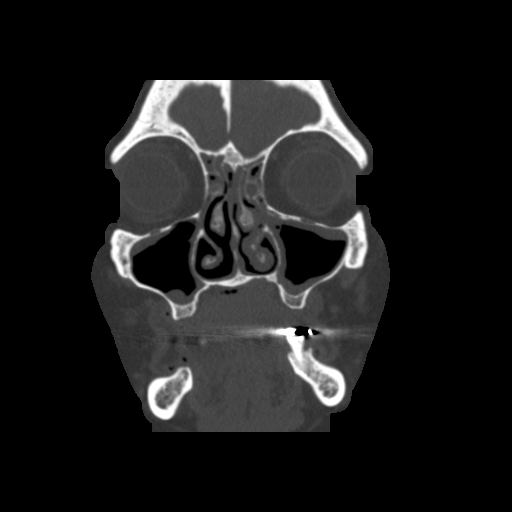
[im 33/74  bone]
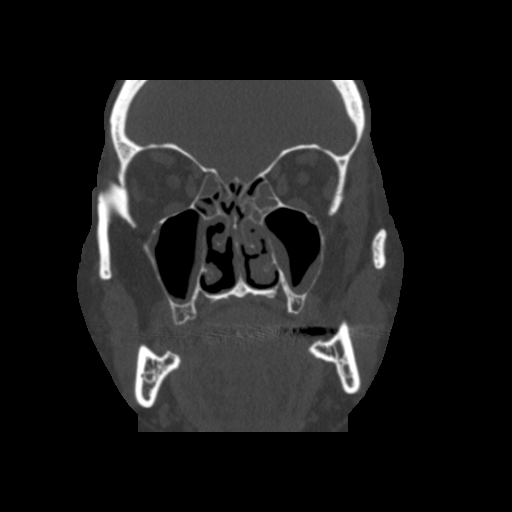
[im 41/74  bone]
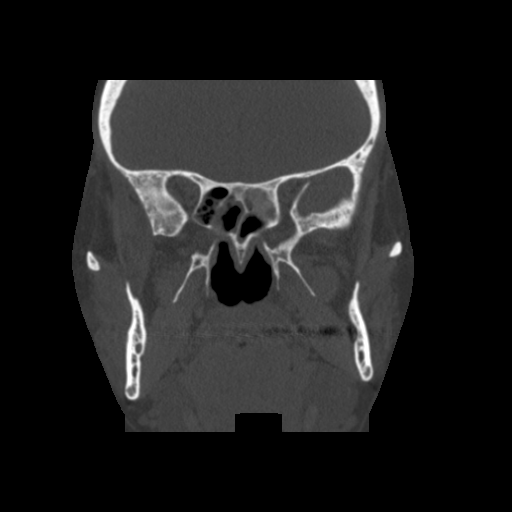

[sta sag · sagittal · 0.41mm/px · 3 of 76 slices shown]
[im 26/76  bone]
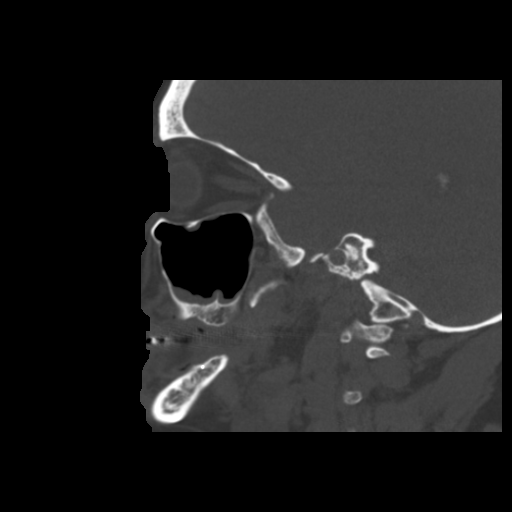
[im 38/76  bone]
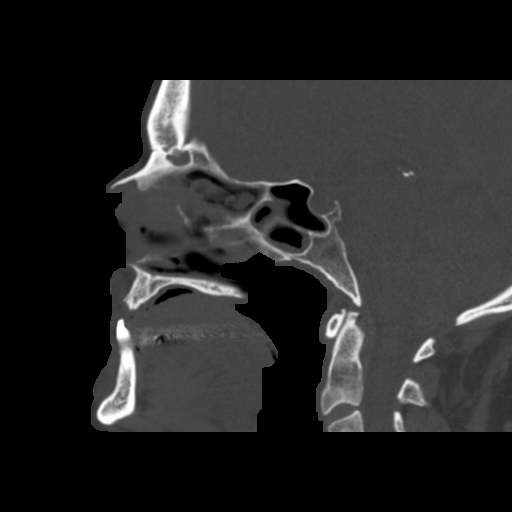
[im 51/76  bone]
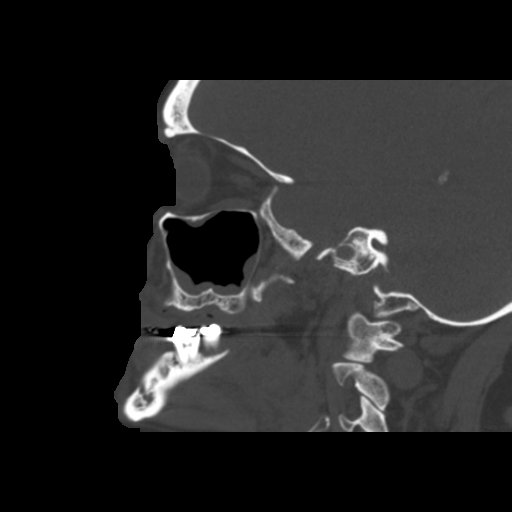

[16 of 47 positions shown; findings below may reference images not displayed]

FINDINGS: Mild to moderate mucosal edema in the maxillary and
ethmoid sinuses bilaterally.  Mucosal thickening also in the
frontal sinuses.  Probable air-fluid levels in the frontal sinuses
bilaterally.  Mucosal thickening is present in the sphenoid sinus
bilaterally.

Mucosal edema/effusion in the mastoid sinus bilaterally with
partial opacification of the mastoid tip bilaterally.  Mild soft
tissue thickening in the middle ear on the right.  Left middle ear
is clear.

No acute bony abnormality.  No mass or edema in the orbit.
IMPRESSION: Moderate sinusitis diffusely.  Probable air-fluid levels in the
frontal sinus bilaterally.  No acute bony change.

## 2014-02-13 ENCOUNTER — Other Ambulatory Visit: Payer: Self-pay | Admitting: *Deleted

## 2014-02-13 MED ORDER — OXYBUTYNIN CHLORIDE ER 15 MG PO TB24: 15.0000 mg | ORAL_TABLET | Freq: Every day | ORAL | Status: DC

## 2014-02-13 MED ORDER — BUSPIRONE HCL 15 MG PO TABS: 15.0000 mg | ORAL_TABLET | Freq: Two times a day (BID) | ORAL | Status: DC

## 2014-02-13 MED ORDER — LISINOPRIL 20 MG PO TABS: ORAL_TABLET | ORAL | Status: DC

## 2014-02-14 ENCOUNTER — Other Ambulatory Visit: Payer: Self-pay | Admitting: *Deleted

## 2014-02-14 MED ORDER — LOVASTATIN 20 MG PO TABS: ORAL_TABLET | ORAL | Status: DC

## 2014-03-08 ENCOUNTER — Other Ambulatory Visit (HOSPITAL_COMMUNITY): Payer: Self-pay | Admitting: *Deleted

## 2014-03-09 ENCOUNTER — Encounter (HOSPITAL_COMMUNITY)
Admission: RE | Admit: 2014-03-09 | Discharge: 2014-03-09 | Disposition: A | Discharge status: Home / Self Care | Payer: Medicare Other | Source: Ambulatory Visit | Attending: Gastroenterology | Admitting: Gastroenterology

## 2014-03-09 DIAGNOSIS — K5090 Crohn's disease, unspecified, without complications: Secondary | ICD-10-CM | POA: Insufficient documentation

## 2014-03-09 MED ORDER — LORATADINE 10 MG PO TABS: 10.0000 mg | ORAL_TABLET | ORAL | Status: DC

## 2014-03-09 MED ORDER — SODIUM CHLORIDE 0.9 % IV SOLN
INTRAVENOUS | Status: DC
  Administered 2014-03-09: 10:00:00 via INTRAVENOUS

## 2014-03-09 MED ORDER — SODIUM CHLORIDE 0.9 % IV SOLN
500.0000 mg | INTRAVENOUS | Status: DC
  Administered 2014-03-09: 500 mg via INTRAVENOUS
  Filled 2014-03-09: qty 50

## 2014-03-09 MED ORDER — ACETAMINOPHEN 500 MG PO TABS: 1000.0000 mg | ORAL_TABLET | ORAL | Status: DC

## 2014-03-12 ENCOUNTER — Other Ambulatory Visit: Payer: Self-pay | Admitting: *Deleted

## 2014-03-12 MED ORDER — AMLODIPINE BESYLATE 5 MG PO TABS: ORAL_TABLET | ORAL | Status: DC

## 2014-03-19 ENCOUNTER — Other Ambulatory Visit: Payer: Self-pay | Admitting: Gastroenterology

## 2014-03-19 ENCOUNTER — Other Ambulatory Visit: Payer: Self-pay | Admitting: Internal Medicine

## 2014-03-19 MED ORDER — BUSPIRONE HCL 15 MG PO TABS: 15.0000 mg | ORAL_TABLET | Freq: Two times a day (BID) | ORAL | Status: DC

## 2014-03-26 ENCOUNTER — Other Ambulatory Visit: Payer: Self-pay | Admitting: *Deleted

## 2014-03-28 MED ORDER — CETIRIZINE HCL 10 MG PO TBDP: 10.0000 mg | ORAL_TABLET | Freq: Every day | ORAL | Status: AC

## 2014-05-04 ENCOUNTER — Ambulatory Visit (HOSPITAL_COMMUNITY)
Admission: RE | Admit: 2014-05-04 | Discharge: 2014-05-04 | Disposition: A | Discharge status: Home / Self Care | Payer: Medicare Other | Source: Ambulatory Visit | Attending: Gastroenterology | Admitting: Gastroenterology

## 2014-05-04 DIAGNOSIS — K5090 Crohn's disease, unspecified, without complications: Secondary | ICD-10-CM | POA: Diagnosis not present

## 2014-05-04 DIAGNOSIS — Z79899 Other long term (current) drug therapy: Secondary | ICD-10-CM | POA: Diagnosis not present

## 2014-05-04 MED ORDER — SODIUM CHLORIDE 0.9 % IV SOLN
500.0000 mg | INTRAVENOUS | Status: DC
  Administered 2014-05-04: 500 mg via INTRAVENOUS
  Filled 2014-05-04: qty 50

## 2014-05-04 MED ORDER — SODIUM CHLORIDE 0.9 % IV SOLN
INTRAVENOUS | Status: DC
  Administered 2014-05-04: 250 mL via INTRAVENOUS

## 2014-05-04 MED ORDER — LORATADINE 10 MG PO TABS: 10.0000 mg | ORAL_TABLET | ORAL | Status: DC

## 2014-05-04 MED ORDER — ACETAMINOPHEN 500 MG PO TABS: 1000.0000 mg | ORAL_TABLET | ORAL | Status: DC

## 2014-05-14 ENCOUNTER — Other Ambulatory Visit: Payer: Self-pay | Admitting: *Deleted

## 2014-05-15 ENCOUNTER — Encounter: Payer: Self-pay | Admitting: *Deleted

## 2014-05-15 MED ORDER — LISINOPRIL 20 MG PO TABS: ORAL_TABLET | ORAL | Status: DC

## 2014-05-25 ENCOUNTER — Other Ambulatory Visit: Payer: Self-pay | Admitting: Internal Medicine

## 2014-05-25 ENCOUNTER — Other Ambulatory Visit: Payer: Self-pay | Admitting: *Deleted

## 2014-05-25 MED ORDER — PAROXETINE HCL 30 MG PO TABS: 60.0000 mg | ORAL_TABLET | ORAL | Status: DC

## 2014-05-25 NOTE — Telephone Encounter (Signed)
Pt states she just took her last dose, needs it today

## 2014-06-11 ENCOUNTER — Other Ambulatory Visit: Payer: Self-pay | Admitting: Internal Medicine

## 2014-06-21 ENCOUNTER — Telehealth: Payer: Self-pay | Admitting: *Deleted

## 2014-06-21 NOTE — Telephone Encounter (Signed)
Received faxed PA request from pt's pharmacy for her Nexium.  Medication is non-preferred.  The only preferred medication on pt's plan is Dexilant 30mg  and 60mg , both with once daily dosing.  Will forward info to pcp for review and medication change if appropriate.  Please advise. Cassady6/16/20164:34 PM

## 2014-06-24 MED ORDER — DEXLANSOPRAZOLE 30 MG PO CPDR: 30.0000 mg | DELAYED_RELEASE_CAPSULE | Freq: Every day | ORAL | Status: AC

## 2014-06-29 ENCOUNTER — Encounter (HOSPITAL_COMMUNITY)
Admission: RE | Admit: 2014-06-29 | Discharge: 2014-06-29 | Disposition: A | Discharge status: Home / Self Care | Payer: Medicare Other | Source: Ambulatory Visit | Attending: Gastroenterology | Admitting: Gastroenterology

## 2014-06-29 DIAGNOSIS — K5090 Crohn's disease, unspecified, without complications: Secondary | ICD-10-CM | POA: Diagnosis not present

## 2014-06-29 MED ORDER — SODIUM CHLORIDE 0.9 % IV SOLN
500.0000 mg | INTRAVENOUS | Status: DC
  Administered 2014-06-29: 500 mg via INTRAVENOUS
  Filled 2014-06-29: qty 50

## 2014-06-29 MED ORDER — ACETAMINOPHEN 500 MG PO TABS: 1000.0000 mg | ORAL_TABLET | Freq: Once | ORAL | Status: DC

## 2014-06-29 MED ORDER — LORATADINE 10 MG PO TABS: 10.0000 mg | ORAL_TABLET | Freq: Every day | ORAL | Status: DC

## 2014-06-29 MED ORDER — SODIUM CHLORIDE 0.9 % IV SOLN
INTRAVENOUS | Status: DC
  Administered 2014-06-29: 11:00:00 via INTRAVENOUS

## 2014-07-09 ENCOUNTER — Other Ambulatory Visit: Payer: Self-pay | Admitting: Internal Medicine

## 2014-07-10 NOTE — Telephone Encounter (Signed)
Has aug appt with PCP 

## 2014-07-18 ENCOUNTER — Encounter: Payer: Self-pay | Admitting: *Deleted

## 2014-08-03 ENCOUNTER — Other Ambulatory Visit: Payer: Self-pay | Admitting: Internal Medicine

## 2014-08-22 ENCOUNTER — Other Ambulatory Visit: Payer: Self-pay | Admitting: Internal Medicine

## 2014-08-24 ENCOUNTER — Ambulatory Visit (INDEPENDENT_AMBULATORY_CARE_PROVIDER_SITE_OTHER): Payer: Medicare Other | Admitting: Internal Medicine

## 2014-08-24 ENCOUNTER — Ambulatory Visit (HOSPITAL_COMMUNITY)
Admission: RE | Admit: 2014-08-24 | Discharge: 2014-08-24 | Disposition: A | Discharge status: Home / Self Care | Payer: Medicare Other | Source: Ambulatory Visit | Attending: Oncology | Admitting: Oncology

## 2014-08-24 ENCOUNTER — Ambulatory Visit (HOSPITAL_COMMUNITY)
Admission: RE | Admit: 2014-08-24 | Discharge: 2014-08-24 | Disposition: A | Discharge status: Home / Self Care | Payer: Medicare Other | Source: Ambulatory Visit | Attending: Gastroenterology | Admitting: Gastroenterology

## 2014-08-24 ENCOUNTER — Encounter: Payer: Self-pay | Admitting: Internal Medicine

## 2014-08-24 VITALS — BP 139/71 | HR 49 | Temp 98.2°F | Wt 237.2 lb

## 2014-08-24 DIAGNOSIS — M1711 Unilateral primary osteoarthritis, right knee: Secondary | ICD-10-CM

## 2014-08-24 DIAGNOSIS — N3946 Mixed incontinence: Secondary | ICD-10-CM | POA: Diagnosis not present

## 2014-08-24 DIAGNOSIS — M179 Osteoarthritis of knee, unspecified: Secondary | ICD-10-CM | POA: Diagnosis not present

## 2014-08-24 DIAGNOSIS — F17210 Nicotine dependence, cigarettes, uncomplicated: Secondary | ICD-10-CM

## 2014-08-24 DIAGNOSIS — Z0000 Encounter for general adult medical examination without abnormal findings: Secondary | ICD-10-CM

## 2014-08-24 DIAGNOSIS — M25561 Pain in right knee: Secondary | ICD-10-CM | POA: Diagnosis present

## 2014-08-24 DIAGNOSIS — B86 Scabies: Secondary | ICD-10-CM | POA: Diagnosis not present

## 2014-08-24 MED ORDER — LORATADINE 10 MG PO TABS
10.0000 mg | ORAL_TABLET | Freq: Every day | ORAL | Status: DC
Start: 2014-08-24 — End: 2014-08-25

## 2014-08-24 MED ORDER — SODIUM CHLORIDE 0.9 % IV SOLN
500.0000 mg | INTRAVENOUS | Status: DC
  Administered 2014-08-24: 500 mg via INTRAVENOUS
  Filled 2014-08-24: qty 50

## 2014-08-24 MED ORDER — ACETAMINOPHEN 500 MG PO TABS
1000.0000 mg | ORAL_TABLET | Freq: Once | ORAL | Status: DC
Start: 2014-08-24 — End: 2014-08-25

## 2014-08-24 MED ORDER — BUSPIRONE HCL 15 MG PO TABS: 15.0000 mg | ORAL_TABLET | Freq: Two times a day (BID) | ORAL | Status: DC

## 2014-08-24 MED ORDER — PERMETHRIN 1 % EX LOTN: TOPICAL_LOTION | CUTANEOUS | Status: DC

## 2014-08-24 MED ORDER — OXYBUTYNIN CHLORIDE ER 10 MG PO TB24: 20.0000 mg | ORAL_TABLET | Freq: Every day | ORAL | Status: DC

## 2014-08-24 NOTE — Patient Instructions (Signed)
Apply permethrin cream as instructed. Allow at least 8 hours after treatment before washing/showering. Repeat in 1-2 weeks if needed with incomplete resolution of symptoms.  We will call with results of your knee xrays. We will discuss referral to orthopedic surgery for knee replacement after these results.

## 2014-08-24 NOTE — Progress Notes (Signed)
Subjective:   Patient ID: Jessica [name] female   DOB: [birth year] 59 y.o.   MRN: [id number]  HPI: Jessica [name] is a 59 y.o. woman with past medical history as described below presenting with complaints of worsening right knee pain and weakness, diffuse pruritic rash for several weeks, and intermittent upper extremity pain and numbness. She is coming to clinic from infusion center for Remicade this morning.  Please see problem based assessment and plan for additional details.  Past Medical History  Diagnosis Date  . Rheumatoid arthritis(714.0)   . Crohn's disease   . Hypertension   . Overactive bladder   . Hyperlipidemia   . Anxiety   . GERD (gastroesophageal reflux disease)   . Bronchitis    Current Outpatient Prescriptions  Medication Sig Dispense Refill  . albuterol (PROVENTIL HFA;VENTOLIN HFA) 108 (90 BASE) MCG/ACT inhaler Inhale 2 puffs into the lungs every 6 (six) hours as needed. 1 Inhaler 6  . amLODipine (NORVASC) 5 MG tablet TAKE ONE TABLET BY MOUTH ONCE DAILY 90 tablet 2  . busPIRone (BUSPAR) 15 MG tablet Take 1 tablet (15 mg total) by mouth 2 (two) times daily. 60 tablet 3  . Calcium Carbonate-Vitamin D (CALCIUM 600 + D PO) Take 1 tablet by mouth 2 (two) times daily.    . Cetirizine HCl 10 MG TBDP Take 10 mg by mouth daily. 90 tablet 3  . Dexlansoprazole 30 MG capsule Take 1 capsule (30 mg total) by mouth daily. 30 capsule 1  . diclofenac sodium (VOLTAREN) 1 % GEL Apply 2 g topically 4 (four) times daily as needed. 100 g 2  . lisinopril (PRINIVIL,ZESTRIL) 20 MG tablet TAKE ONE TABLET BY MOUTH AT BEDTIME 90 tablet 0  . lovastatin (MEVACOR) 20 MG tablet TAKE ONE TABLET BY MOUTH AT BEDTIME 90 tablet 3  . Mesalamine (DELZICOL) 400 MG CPDR Take 1,200 mg by mouth daily.     Marland Kitchen oxybutynin (DITROPAN XL) 15 MG 24 hr tablet TAKE 1 TABLET (15 MG TOTAL) BY MOUTH AT BEDTIME. 30 tablet 2  . PARoxetine (PAXIL) 30 MG tablet Take 2 tablets (60 mg total) by mouth every morning.  180 tablet 1  . PARoxetine (PAXIL) 30 MG tablet TAKE 2 TABLETS BY MOUTH EVERY MORNING 60 tablet 1  . traZODone (DESYREL) 100 MG tablet TAKE 1 TABLET BY MOUTH AT BEDTIME AS NEEDED 30 tablet 5   No current facility-administered medications for this visit.   Facility-Administered Medications Ordered in Other Visits  Medication Dose Route Frequency Provider Last Rate Last Dose  . acetaminophen (TYLENOL) tablet 1,000 mg  1,000 mg Oral Once Carman Ching, MD   1,000 mg at 08/24/14 0811  . inFLIXimab (REMICADE) 500 mg in sodium chloride 0.9 % 250 mL infusion  500 mg Intravenous Q8 weeks Carman Ching, MD 250 mL/hr at 08/24/14 0950 500 mg at 08/24/14 0950  . loratadine (CLARITIN) tablet 10 mg  10 mg Oral Daily Carman Ching, MD   10 mg at 08/24/14 2536   Family History  Problem Relation Age of Onset  . Heart disease Mother     died from pain med OD  . Cancer Paternal Grandmother     Colon cancer  . Heart disease Father   . Diabetes Maternal Grandmother    Social History   Social History  . Marital Status: Widowed    Spouse Name: N/A  . Number of Children: N/A  . Years of Education: N/A   Social History Main Topics  . Smoking  status: Current Every Day Smoker -- 0.50 packs/day for 44 years    Types: Cigarettes    Last Attempt to Quit: 07/30/2011  . Smokeless tobacco: None     Comment: stsed does not plan to stop  . Alcohol Use: No  . Drug Use: No  . Sexual Activity: Not Asked   Other Topics Concern  . None   Social History Narrative   Review of Systems: Review of Systems  Constitutional: Negative for fever, chills and weight loss.  HENT: Positive for congestion. Negative for ear pain.   Eyes: Negative for blurred vision.  Respiratory: Positive for cough. Negative for shortness of breath and wheezing.   Cardiovascular: Negative for chest pain and palpitations.  Gastrointestinal: Positive for abdominal pain and diarrhea. Negative for nausea.  Genitourinary: Negative for  dysuria.  Musculoskeletal: Positive for joint pain. Negative for back pain, falls and neck pain.  Skin: Positive for itching and rash.  Neurological: Negative for dizziness and headaches.    Objective:  Physical Exam: Filed Vitals:   08/24/14 1351  BP: 139/71  Pulse: 49  Temp: 98.2 F (36.8 C)  TempSrc: Oral  Weight: 237 lb 3.2 oz (107.593 kg)  SpO2: 99%   GENERAL- alert, co-operative, obese, NAD HEENT- Atraumatic, PERRL, EOMI, oral mucosa appears moist CARDIAC- RRR, no murmurs, rubs or gallops. RESP- CTAB, no wheezes or crackles. BACK- Normal curvature, no paraspinal tenderness, no CVA tenderness. NEURO- No obvious Cr N abnormality, strength upper and lower extremities- 5/5, Sensation intact- globally, DTRs- Normal, Gait- antalgic gait favoring left leg with cane supporting right EXTREMITIES- pulse 2+, symmetric, no pedal edema, gross bony deformity of the right knee with valgus deviation, severe crepitus during range of motion with catching on extension SKIN- scattered papillary/vesicular rash on upper arms and upper legs trunk and back of neck, intensely pruritic, digital webs and forearms spared, no surrounding erythema, many excoriations PSYCH- Normal mood and affect, appropriate thought content and speech.   Assessment & Plan:

## 2014-08-25 DIAGNOSIS — B86 Scabies: Secondary | ICD-10-CM | POA: Insufficient documentation

## 2014-08-25 NOTE — Assessment & Plan Note (Signed)
Patient informed of mammography, TDaP being appropriate at this time. She deferred these routine health maintenance at this time until acute symptoms of her knee arthritis and diffuse rash are addressed. Follow up at next visit.

## 2014-08-25 NOTE — Assessment & Plan Note (Addendum)
Worsening pain in the right knee and now favoring this knee markedly when walking. She supports a large portion of weight on a cane to her right. Knee also has catching, severe crepitus on ROM during examination. Valgus deviation present. Pain is adequately controlled with NSAIDs, but impact on gait is worse than before. She reports receiving steroid injections to the knee with temporary relief from a previous physician. Given this disease progression, her functional status, therapies to date, and age she would likely benefit form consideration for total knee arthroplasty. -Continue tylenol, voltaren gel PRN for pain -DG right knee today -Refer to Orthopedic surgery for evaluation  She also has some complaint of b/l upper extremity pain in her hands worst from prolonged driving. No bony deformity, weakness, or other physical exam findings today. No red flags, strength is 5/5, no physical exam findings, and resolves completely at rest. May be related to OA vs inflammatory arthritis vs radiculopathy. -Follow up at next visit, consider imaging if symptoms progress.

## 2014-08-25 NOTE — Assessment & Plan Note (Signed)
Incontinence primarily of urge type pattern from history. Currently taking oxybutynin 15mg  XR qHS without great control. -Increase to 20mg  XR daily -Consider increase to 30mg  at subsequent visit if symptoms are not improved on 20mg , versus consider alternative agent

## 2014-08-25 NOTE — Assessment & Plan Note (Signed)
Intensely pruritic rash on trunk, arms, legs that has been present several weeks. History of starting after contact with her grandchildren of primary school age who have had similar rash off and on over greater than the past month. Appearance is consistent with scabies rash and has several excoriations from pruritis. However, digital webs are spared. No other recent changes to medications, travel, detergents or soaps is reported. No fever or respiratory symptoms. -Permethrin cream, instructions for use provided -Given instructions on cleaning linens and clothing with warm water

## 2014-08-27 NOTE — Progress Notes (Signed)
Internal Medicine Clinic Attending  I saw and evaluated the patient.  I personally confirmed the key portions of the history and exam documented by Dr. Rice and I reviewed pertinent patient test results.  The assessment, diagnosis, and plan were formulated together and I agree with the documentation in the resident's note.  

## 2014-09-13 ENCOUNTER — Telehealth: Payer: Self-pay | Admitting: Internal Medicine

## 2014-09-13 DIAGNOSIS — M1711 Unilateral primary osteoarthritis, right knee: Secondary | ICD-10-CM

## 2014-09-13 NOTE — Telephone Encounter (Signed)
Attempted to call patient twice at home and work phone numbers provided without answer. Will try again but placing orders to front desk staff as well.  Patient seen by me in clinic on 8/19 and had xray of right knee. Expected result was osteoarthritis and discussed referral to orthopedic surgery at that time. Significant osteoarthritis present on xrayl. F/U appt at [facility] was never scheduled, need to call patient and arrange one within next month if possible. Also need to contact orthopedic surgery and arrange a clinic appointment for evaluation. Order for referral was placed today. Routine priority.

## 2014-09-13 NOTE — Telephone Encounter (Signed)
Attempted to contact pt at numbers listed on chart, no answer, will mail letter to patient and referral given to coordinator of the month. Criss Alvine, Jermario Kalmar Cassady9/8/20163:44 PM

## 2014-09-17 ENCOUNTER — Encounter: Payer: Self-pay | Admitting: Internal Medicine

## 2014-10-19 ENCOUNTER — Other Ambulatory Visit (HOSPITAL_COMMUNITY): Payer: Self-pay | Admitting: *Deleted

## 2014-10-19 ENCOUNTER — Encounter (HOSPITAL_COMMUNITY)
Admission: RE | Admit: 2014-10-19 | Discharge: 2014-10-19 | Disposition: A | Discharge status: Home / Self Care | Payer: Medicare Other | Source: Ambulatory Visit | Attending: Gastroenterology | Admitting: Gastroenterology

## 2014-10-19 ENCOUNTER — Ambulatory Visit (INDEPENDENT_AMBULATORY_CARE_PROVIDER_SITE_OTHER): Payer: Medicare Other | Admitting: Internal Medicine

## 2014-10-19 VITALS — BP 122/74 | HR 63 | Temp 98.0°F | Wt 232.9 lb

## 2014-10-19 DIAGNOSIS — B86 Scabies: Secondary | ICD-10-CM | POA: Diagnosis not present

## 2014-10-19 DIAGNOSIS — M25561 Pain in right knee: Secondary | ICD-10-CM

## 2014-10-19 DIAGNOSIS — K5090 Crohn's disease, unspecified, without complications: Secondary | ICD-10-CM | POA: Insufficient documentation

## 2014-10-19 DIAGNOSIS — F17210 Nicotine dependence, cigarettes, uncomplicated: Secondary | ICD-10-CM | POA: Diagnosis not present

## 2014-10-19 DIAGNOSIS — M150 Primary generalized (osteo)arthritis: Secondary | ICD-10-CM

## 2014-10-19 DIAGNOSIS — M159 Polyosteoarthritis, unspecified: Secondary | ICD-10-CM

## 2014-10-19 DIAGNOSIS — M069 Rheumatoid arthritis, unspecified: Secondary | ICD-10-CM

## 2014-10-19 DIAGNOSIS — M8949 Other hypertrophic osteoarthropathy, multiple sites: Secondary | ICD-10-CM

## 2014-10-19 MED ORDER — LORATADINE 10 MG PO TABS: 10.0000 mg | ORAL_TABLET | ORAL | Status: DC

## 2014-10-19 MED ORDER — SODIUM CHLORIDE 0.9 % IV SOLN
500.0000 mg | INTRAVENOUS | Status: DC
  Administered 2014-10-19: 500 mg via INTRAVENOUS
  Filled 2014-10-19: qty 50

## 2014-10-19 MED ORDER — ACETAMINOPHEN 325 MG PO TABS: 650.0000 mg | ORAL_TABLET | ORAL | Status: DC

## 2014-10-19 MED ORDER — IVERMECTIN 3 MG PO TABS: 200.0000 ug/kg | ORAL_TABLET | Freq: Once | ORAL | Status: AC

## 2014-10-19 MED ORDER — SODIUM CHLORIDE 0.9 % IV SOLN
INTRAVENOUS | Status: DC
  Administered 2014-10-19: 250 mL via INTRAVENOUS

## 2014-10-19 NOTE — Patient Instructions (Signed)
Thank you for coming to see [place] today!  A prescription has been sent to your pharmacy: Ivermectin.  You will just take this medicine one time.  A referral has been sent to orthopedic surgery for your right knee arthritis.  They will contact you regarding setting up an appointment time.  In the meantime, if you want [place] to schedule you for a knee injection we would be happy to do so.  When scheduling an appointment, just let the front office staff know that is what you are scheduling an appointment for.  Thanks.  Arthritis Arthritis means joint pain. It can also mean joint disease. A joint is a place where bones come together. People who have arthritis may have:  Red joints.  Swollen joints.  Stiff joints.  Warm joints.  A fever.  A feeling of being sick. HOME CARE Pay attention to any changes in your symptoms. Take these actions to help with your pain and swelling. Medicines  Take over-the-counter and prescription medicines only as told by your doctor.  Do not take aspirin for pain if your doctor says that you may have gout. Activities  Rest your joint if your doctor tells you to.  Avoid activities that make the pain worse.  Exercise your joint regularly as told by your doctor. Try doing exercises like:  Swimming.  Water aerobics.  Biking.  Walking. Joint Care  If your joint is swollen, keep it raised (elevated) if told by your doctor.  If your joint feels stiff in the morning, try taking a warm shower.  If you have diabetes, do not apply heat without asking your doctor.  If told, apply heat to the joint:  Put a towel between the joint and the hot pack or heating pad.  Leave the heat on the area for 20-30 minutes.  If told, apply ice to the joint:  Put ice in a plastic bag.  Place a towel between your skin and the bag.  Leave the ice on for 20 minutes, 2-3 times per day.  Keep all follow-up visits as told by your doctor. GET HELP IF:  The pain gets  worse.  You have a fever. GET HELP RIGHT AWAY IF:  You have very bad pain in your joint.  You have swelling in your joint.  Your joint is red.  Many joints become painful and swollen.  You have very bad back pain.  Your leg is very weak.  You cannot control your pee (urine) or poop (stool).   This information is not intended to replace advice given to you by your health care provider. Make sure you discuss any questions you have with your health care provider.   Document Released: 03/18/2009 Document Revised: 09/12/2014 Document Reviewed: 03/19/2014 Elsevier Interactive Patient Education [employer]

## 2014-10-19 NOTE — Assessment & Plan Note (Signed)
Assessment: Patient here today with continued right knee pain due to arthritis.  States that she compensates for this by bearing a lot of weight on her cane as well as her left side.  Reports that this results often times in her back aching her as well.  X-ray ordered at last [facility] visit revealed tricompartmental osteoarthritis with the lateral and patelofemoral compartments most involved.  On physical exam, ROM is limited with severe crepitus noted on the right along with increased swelling when compared to the left.  She reports having received steroid injections in the past to her knee with temporary relief.  Referral was placed at last [facility] visit for her to see orthopedic surgery.  Plan: -continue conservative medical management with Tylenol and Voltaren Gel prn for pain -follow up referral to orthopedic surgery for evaluation -in the interim, will schedule patient prn in [facility] for knee injections.  Instructed patient to notify front office staff when scheduling that the purpose was for injection so that we will be able to accommodate her appropriately

## 2014-10-19 NOTE — Assessment & Plan Note (Signed)
Assessment: Patient still complaining of pruritic rash on trunk, arms, flexor surface of wrists.  States the Permethrin cream prescribed at last [facility] visit did not help and she has difficulty reaching areas of pruritis on her trunk especially.  States her grandchildren were given an oral medication and this cleared their symptoms.   Plan: -Ivermectin 268mcg/kg x 1 -instructed her to clean linens and clothing with warm water

## 2014-10-19 NOTE — Progress Notes (Signed)
Patient ID: Jessica [name], female   DOB: 09-03-1955, 59 y.o.   MRN: [id number]   Subjective:   Patient ID: Jessica [name] female   DOB: 06/05/1955 59 y.o.   MRN: [id number]  HPI: [name] is a 59 y.o. female with past medical hx of RA, Crohn's disease, HTN, overactive bladder, HLD, anxiety, GERD, osteoarthritis who presents today for follow up of worsening right knee due to arthritis.  She is also presenting for follow up of her scabies being treated with Permethrin 1% cream.  Please see Problem List for Assessment and Plan.    Past Medical History  Diagnosis Date  . Rheumatoid arthritis(714.0)   . Crohn's disease   . Hypertension   . Overactive bladder   . Hyperlipidemia   . Anxiety   . GERD (gastroesophageal reflux disease)   . Bronchitis    Current Outpatient Prescriptions  Medication Sig Dispense Refill  . albuterol (PROVENTIL HFA;VENTOLIN HFA) 108 (90 BASE) MCG/ACT inhaler Inhale 2 puffs into the lungs every 6 (six) hours as needed. 1 Inhaler 6  . amLODipine (NORVASC) 5 MG tablet TAKE ONE TABLET BY MOUTH ONCE DAILY 90 tablet 2  . busPIRone (BUSPAR) 15 MG tablet Take 1 tablet (15 mg total) by mouth 2 (two) times daily. 60 tablet 3  . Calcium Carbonate-Vitamin D (CALCIUM 600 + D PO) Take 1 tablet by mouth 2 (two) times daily.    . Cetirizine HCl 10 MG TBDP Take 10 mg by mouth daily. 90 tablet 3  . Dexlansoprazole 30 MG capsule Take 1 capsule (30 mg total) by mouth daily. 30 capsule 1  . diclofenac sodium (VOLTAREN) 1 % GEL Apply 2 g topically 4 (four) times daily as needed. 100 g 2  . ivermectin (STROMECTOL) 3 MG TABS tablet Take 7 tablets (21,000 mcg total) by mouth once. 1 tablet 0  . lisinopril (PRINIVIL,ZESTRIL) 20 MG tablet TAKE ONE TABLET BY MOUTH AT BEDTIME 90 tablet 0  . lovastatin (MEVACOR) 20 MG tablet TAKE ONE TABLET BY MOUTH AT BEDTIME 90 tablet 3  . Mesalamine (DELZICOL) 400 MG CPDR Take 1,200 mg by mouth daily.     Marland Kitchen oxybutynin (DITROPAN-XL) 10 MG  24 hr tablet Take 2 tablets (20 mg total) by mouth at bedtime. 60 tablet 2  . PARoxetine (PAXIL) 30 MG tablet Take 2 tablets (60 mg total) by mouth every morning. 180 tablet 1  . traZODone (DESYREL) 100 MG tablet TAKE 1 TABLET BY MOUTH AT BEDTIME AS NEEDED 30 tablet 5   No current facility-administered medications for this visit.   Facility-Administered Medications Ordered in Other Visits  Medication Dose Route Frequency Provider Last Rate Last Dose  . 0.9 %  sodium chloride infusion   Intravenous Q8 Duane Boston, MD 10 mL/hr at 10/19/14 0917 250 mL at 10/19/14 0917  . acetaminophen (TYLENOL) tablet 650 mg  650 mg Oral Q8 Duane Boston, MD   650 mg at 10/19/14 0914  . inFLIXimab (REMICADE) 500 mg in sodium chloride 0.9 % 250 mL infusion  500 mg Intravenous Q8 weeks Carman Ching, MD 250 mL/hr at 10/19/14 1040 500 mg at 10/19/14 1040  . loratadine (CLARITIN) tablet 10 mg  10 mg Oral Q8 Weeks Carman Ching, MD   10 mg at 10/19/14 0865   Family History  Problem Relation Age of Onset  . Heart disease Mother     died from pain med OD  . Cancer Paternal Grandmother     Colon cancer  .  Heart disease Father   . Diabetes Maternal Grandmother    Social History   Social History  . Marital Status: Widowed    Spouse Name: N/A  . Number of Children: N/A  . Years of Education: N/A   Social History Main Topics  . Smoking status: Current Every Day Smoker -- 0.50 packs/day for 44 years    Types: Cigarettes    Last Attempt to Quit: 07/30/2011  . Smokeless tobacco: Not on file     Comment: stsed does not plan to stop  . Alcohol Use: No  . Drug Use: No  . Sexual Activity: Not on file   Other Topics Concern  . Not on file   Social History Narrative   Review of Systems:  Review of Systems  Constitutional: Negative for fever and chills.  HENT: Negative for congestion and sore throat.   Eyes: Negative for pain.  Respiratory: Negative for cough and shortness of breath.     Cardiovascular: Negative for chest pain.  Gastrointestinal: Negative for abdominal pain, diarrhea and constipation.  Genitourinary: Negative for difficulty urinating.  Musculoskeletal: Positive for joint swelling and arthralgias.  Skin: Positive for rash.  Neurological: Negative for dizziness and speech difficulty.     Objective:  Physical Exam: Filed Vitals:   10/19/14 1308  BP: 122/74  Pulse: 63  Temp: 98 F (36.7 C)  TempSrc: Oral  Weight: 232 lb 14.4 oz (105.643 kg)  SpO2: 99%   Physical Exam  Constitutional: She is oriented to person, place, and time. She appears well-developed and well-nourished.  HENT:  Head: Normocephalic and atraumatic.  Eyes: EOM are normal.  Neck: Normal range of motion.  Cardiovascular: Normal rate and regular rhythm.   Respiratory: Effort normal and breath sounds normal.  GI: Soft. Bowel sounds are normal.  Musculoskeletal:  Moderate amount of swelling of right knee compared to the left.  No warmth or erythema.  Non-tender to palpation. Crepitus appreciated with ROM.  Neurological: She is alert and oriented to person, place, and time.  Skin: Skin is warm and dry.  Psychiatric: She has a normal mood and affect.     Assessment & Plan:  Please see Problem List for Assessment and Plan.

## 2014-10-22 NOTE — Progress Notes (Signed)
Internal Medicine Clinic Attending  I saw and evaluated the patient.  I personally confirmed the key portions of the history and exam documented by Dr. Earlene Plater and I reviewed pertinent patient test results.  The assessment, diagnosis, and plan were formulated together and I agree with the documentation in the resident's note.  Patient with rheumatoid arthritis and progressive knee pain and crepitus that could be either progressive RA or OA in that joint. She is using remicade infusions for IBD, which she thinks is also controlling her RA. She does not follow with a rheumatologist so it is unclear to me if her RA is being appropriately treated. We offered her a rheumatology referral which she declined. Likely we will have to do conservative therapy with steroid injections which may be difficult as there is no effusion and the joint space is very narrow and eventually refer her to ortho for knee replacement option, which she also does not want to do.

## 2014-11-01 ENCOUNTER — Other Ambulatory Visit: Payer: Self-pay | Admitting: Internal Medicine

## 2014-11-13 ENCOUNTER — Other Ambulatory Visit: Payer: Self-pay | Admitting: Internal Medicine

## 2014-11-27 ENCOUNTER — Encounter: Payer: Self-pay | Admitting: Student

## 2014-12-13 ENCOUNTER — Other Ambulatory Visit: Payer: Self-pay | Admitting: Internal Medicine

## 2014-12-14 ENCOUNTER — Encounter (HOSPITAL_COMMUNITY)
Admission: RE | Admit: 2014-12-14 | Discharge: 2014-12-14 | Disposition: A | Discharge status: Home / Self Care | Payer: Medicare Other | Source: Ambulatory Visit | Attending: Gastroenterology | Admitting: Gastroenterology

## 2014-12-14 DIAGNOSIS — K5090 Crohn's disease, unspecified, without complications: Secondary | ICD-10-CM | POA: Diagnosis not present

## 2014-12-14 MED ORDER — ACETAMINOPHEN 325 MG PO TABS: 650.0000 mg | ORAL_TABLET | ORAL | Status: DC

## 2014-12-14 MED ORDER — SODIUM CHLORIDE 0.9 % IV SOLN
500.0000 mg | INTRAVENOUS | Status: AC
  Administered 2014-12-14: 500 mg via INTRAVENOUS
  Filled 2014-12-14: qty 50

## 2014-12-14 MED ORDER — SODIUM CHLORIDE 0.9 % IV SOLN
INTRAVENOUS | Status: AC
  Administered 2014-12-14: 09:00:00 via INTRAVENOUS

## 2014-12-14 MED ORDER — LORATADINE 10 MG PO TABS: 10.0000 mg | ORAL_TABLET | ORAL | Status: DC

## 2015-01-14 ENCOUNTER — Other Ambulatory Visit: Payer: Self-pay | Admitting: Internal Medicine

## 2015-02-08 ENCOUNTER — Encounter (HOSPITAL_COMMUNITY)
Admission: RE | Admit: 2015-02-08 | Discharge: 2015-02-08 | Disposition: A | Discharge status: Home / Self Care | Payer: Medicare Other | Source: Ambulatory Visit | Attending: Gastroenterology | Admitting: Gastroenterology

## 2015-02-08 DIAGNOSIS — K5090 Crohn's disease, unspecified, without complications: Secondary | ICD-10-CM | POA: Insufficient documentation

## 2015-02-08 MED ORDER — ACETAMINOPHEN 500 MG PO TABS: 1000.0000 mg | ORAL_TABLET | ORAL | Status: DC

## 2015-02-08 MED ORDER — SODIUM CHLORIDE 0.9 % IV SOLN: INTRAVENOUS | Status: DC

## 2015-02-08 MED ORDER — SODIUM CHLORIDE 0.9 % IV SOLN
500.0000 mg | INTRAVENOUS | Status: DC
  Filled 2015-02-08: qty 50

## 2015-02-08 MED ORDER — SODIUM CHLORIDE 0.9 % IV SOLN
INTRAVENOUS | Status: DC
  Administered 2015-02-08: 10:00:00 via INTRAVENOUS

## 2015-02-08 MED ORDER — LORATADINE 10 MG PO TABS: 10.0000 mg | ORAL_TABLET | Freq: Every day | ORAL | Status: DC

## 2015-02-08 MED ORDER — ACETAMINOPHEN 325 MG PO TABS: 650.0000 mg | ORAL_TABLET | ORAL | Status: DC

## 2015-02-08 MED ORDER — SODIUM CHLORIDE 0.9 % IV SOLN
500.0000 mg | INTRAVENOUS | Status: DC
  Administered 2015-02-08: 500 mg via INTRAVENOUS

## 2015-02-08 MED ORDER — LORATADINE 10 MG PO TABS: 10.0000 mg | ORAL_TABLET | ORAL | Status: DC

## 2015-02-14 ENCOUNTER — Inpatient Hospital Stay (HOSPITAL_COMMUNITY)
Admission: RE | Admit: 2015-02-14 | Discharge: 2015-02-14 | Disposition: A | Discharge status: Home / Self Care | Payer: Medicare Other | Source: Ambulatory Visit | Attending: Gastroenterology | Admitting: Gastroenterology

## 2015-03-04 ENCOUNTER — Other Ambulatory Visit: Payer: Self-pay | Admitting: Internal Medicine

## 2015-04-03 ENCOUNTER — Other Ambulatory Visit (HOSPITAL_COMMUNITY): Payer: Self-pay | Admitting: *Deleted

## 2015-04-03 MED FILL — Infliximab For IV Inj 100 MG: INTRAVENOUS | Qty: 50 | Status: AC

## 2015-04-03 MED FILL — Sodium Chloride IV Soln 0.9%: INTRAVENOUS | Qty: 250 | Status: AC

## 2015-04-04 ENCOUNTER — Ambulatory Visit (HOSPITAL_COMMUNITY)
Admission: RE | Admit: 2015-04-04 | Discharge: 2015-04-04 | Disposition: A | Discharge status: Home / Self Care | Payer: Medicare Other | Source: Ambulatory Visit | Attending: Gastroenterology | Admitting: Gastroenterology

## 2015-04-04 DIAGNOSIS — K5090 Crohn's disease, unspecified, without complications: Secondary | ICD-10-CM | POA: Diagnosis not present

## 2015-04-04 MED ORDER — LORATADINE 10 MG PO TABS: 10.0000 mg | ORAL_TABLET | ORAL | Status: DC

## 2015-04-04 MED ORDER — ACETAMINOPHEN 500 MG PO TABS: 1000.0000 mg | ORAL_TABLET | ORAL | Status: DC

## 2015-04-04 MED ORDER — SODIUM CHLORIDE 0.9 % IV SOLN
500.0000 mg | INTRAVENOUS | Status: DC
  Filled 2015-04-04: qty 50

## 2015-04-04 MED ORDER — SODIUM CHLORIDE 0.9 % IV SOLN
500.0000 mg | INTRAVENOUS | Status: DC
  Administered 2015-04-04: 500 mg via INTRAVENOUS
  Filled 2015-04-04: qty 50

## 2015-04-04 MED ORDER — SODIUM CHLORIDE 0.9 % IV SOLN
INTRAVENOUS | Status: DC
  Administered 2015-04-04: 09:00:00 via INTRAVENOUS

## 2015-04-05 DIAGNOSIS — K5090 Crohn's disease, unspecified, without complications: Secondary | ICD-10-CM | POA: Diagnosis not present

## 2015-04-23 ENCOUNTER — Other Ambulatory Visit: Payer: Self-pay | Admitting: Internal Medicine

## 2015-05-21 ENCOUNTER — Other Ambulatory Visit: Payer: Self-pay | Admitting: Internal Medicine

## 2015-05-30 ENCOUNTER — Ambulatory Visit (HOSPITAL_COMMUNITY): Payer: Medicare Other

## 2015-07-29 ENCOUNTER — Other Ambulatory Visit: Payer: Self-pay | Admitting: Internal Medicine

## 2015-07-30 NOTE — Telephone Encounter (Signed)
This patient has not been seen since 10/19/2014

## 2015-08-29 ENCOUNTER — Other Ambulatory Visit: Payer: Self-pay | Admitting: Internal Medicine

## 2015-10-01 ENCOUNTER — Other Ambulatory Visit: Payer: Self-pay | Admitting: Student in an Organized Health Care Education/Training Program

## 2016-01-08 ENCOUNTER — Other Ambulatory Visit: Payer: Self-pay | Admitting: Internal Medicine

## 2016-01-09 NOTE — Telephone Encounter (Addendum)
Last appt was 10/19/14. Called pt - answer; left message to call and schedule an appt.

## 2016-08-18 ENCOUNTER — Other Ambulatory Visit: Payer: Self-pay | Admitting: Internal Medicine

## 2016-08-24 ENCOUNTER — Encounter (HOSPITAL_COMMUNITY): Payer: Medicare Other

## 2016-12-07 ENCOUNTER — Ambulatory Visit: Payer: Self-pay | Admitting: Surgery

## 2016-12-07 MED ORDER — BUPIVACAINE LIPOSOME 1.3 % IJ SUSP: 20.0000 mL | INTRAMUSCULAR | Status: AC

## 2016-12-07 NOTE — H&P (Signed)
Jessica [name] 12/07/2016 2:33 PM Location: [facility] Patient #: [id number] DOB: 10-Oct-1955 Widowed / Language: Jessica [name] / Race: White Female  History of Present Illness Jessica Sportsman MD; 12/07/2016 3:39 PM) The patient is a 61 year old female who presents with Crohn's disease. Note for "Crohn's disease": ` ` ` Patient sent for surgical consultation at the request of Dr. Vilinda Boehringer  Chief Complaint: Recurrent perirectal abscess. 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 Crohn's disease.  The patient is a pleasant woman with Crohn's disease for many decades. She's been on Remicade since 2004. Usually gets infusions every 8 weeks. She's had a couple episodes of pain and abscess formation requiring emergent incision and drainage. She lives [place]. Tends to get care at no clots. Occasionally [place]. Occasionally [place]. She had a recurrent episode of infection and required emergent drainage. Vision of fistula. Saw colorectal group in [place]. There were discussing seton placement. She developed bronchitis she feels due to exposure in the emergency department. Surgery postponed. She discussed with her gastroenterologist. She wish to reevaluate with our group since we had done emergent surgery on her in 2004. The patient moves her bowels about 5 times a day. Usually in the morning. History of COPD. Had a flare last year. Finally quit smoking. That is under better control. She comes today by herself. No incontinence issues. No hemorrhoid surgery.  No personal nor family history of GI/colon cancer, irritable bowel syndrome, allergy such as Celiac Sprue, dietary/dairy problems, colitis, ulcers nor gastritis. No recent sick contacts/gastroenteritis. No travel outside the country. No changes in diet. No dysphagia to solids or liquids. No significant heartburn or reflux. No hematochezia, hematemesis, coffee ground emesis. No evidence of prior gastric/peptic  ulceration.  (Review of systems as stated in this history (HPI) or in the review of systems. Otherwise all other 12 point ROS are negative)   Vitals (Jessica [name] CMA; 12/07/2016 2:34 PM) 12/07/2016 2:33 PM Weight: 240 lb Height: 66in Body Surface Area: 2.16 m Body Mass Index: 38.74 kg/m  Pulse: 84 (Regular)  BP: 130/80 (Sitting, Left Arm, Standard)      Physical Exam Jessica Sportsman MD; 12/07/2016 3:23 PM)  General Mental Status-Alert. General Appearance-Not in acute distress, Not Sickly. Orientation-Oriented X3. Hydration-Well hydrated. Voice-Normal.  Integumentary Global Assessment Upon inspection and palpation of skin surfaces of the - Axillae: non-tender, no inflammation or ulceration, no drainage. and Distribution of scalp and body hair is normal. General Characteristics Temperature - normal warmth is noted.  Head and Neck Head-normocephalic, atraumatic with no lesions or palpable masses. Face Global Assessment - atraumatic, no absence of expression. Neck Global Assessment - no abnormal movements, no bruit auscultated on the right, no bruit auscultated on the left, no decreased range of motion, non-tender. Trachea-midline. Thyroid Gland Characteristics - non-tender.  Eye Eyeball - Left-Extraocular movements intact, No Nystagmus. Eyeball - Right-Extraocular movements intact, No Nystagmus. Cornea - Left-No Hazy. Cornea - Right-No Hazy. Sclera/Conjunctiva - Left-No scleral icterus, No Discharge. Sclera/Conjunctiva - Right-No scleral icterus, No Discharge. Pupil - Left-Direct reaction to light normal. Pupil - Right-Direct reaction to light normal. Note: Wears glasses. Vision corrected  ENMT Ears Pinna - Left - no drainage observed, no generalized tenderness observed. Right - no drainage observed, no generalized tenderness observed. Nose and Sinuses External Inspection of the Nose - no destructive lesion observed.  Inspection of the nares - Left - quiet respiration. Right - quiet respiration. Mouth and Throat Lips - Upper Lip - no fissures observed, no pallor noted. Lower Lip -  no fissures observed, no pallor noted. Nasopharynx - no discharge present. Oral Cavity/Oropharynx - Tongue - no dryness observed. Oral Mucosa - no cyanosis observed. Hypopharynx - no evidence of airway distress observed.  Chest and Lung Exam Inspection Movements - Normal and Symmetrical. Accessory muscles - No use of accessory muscles in breathing. Palpation Palpation of the chest reveals - Non-tender. Auscultation Breath sounds - Normal and Clear.  Cardiovascular Auscultation Rhythm - Regular. Murmurs & Other Heart Sounds - Auscultation of the heart reveals - No Murmurs and No Systolic Clicks.  Abdomen Inspection Inspection of the abdomen reveals - No Visible peristalsis and No Abnormal pulsations. Umbilicus - No Bleeding, No Urine drainage. Palpation/Percussion Palpation and Percussion of the abdomen reveal - Soft, Non Tender, No Rebound tenderness, No Rigidity (guarding) and No Cutaneous hyperesthesia. Note: Abdomen soft. Obese. Upper midline incision. Hernia at Upper edge. Possible at inferior edge as well. Mainly noticeable with cough. Not severely distended. No distasis recti. No umbilical or other anterior abdominal wall hernias  Female Genitourinary Sexual Maturity Tanner 5 - Adult hair pattern. Note: No vaginal bleeding nor discharge  Rectal Note: Left anterior external opening about 4 cm from anal verge. Can feel a cord going to the sphincter. Left anterior. 2 x 2 cm area of fibrin for no definite fluctuance. Strongly suggestive of chronic perirectal fistula. Moderate volume of anal tags. Right anterior skin breakdown and ulceration between rectus and introitus  Peripheral Vascular Upper Extremity Inspection - Left - No Cyanotic nailbeds, Not Ischemic. Right - No Cyanotic nailbeds, Not  Ischemic.  Neurologic Neurologic evaluation reveals -normal attention span and ability to concentrate, able to name objects and repeat phrases. Appropriate fund of knowledge , normal sensation and normal coordination. Mental Status Affect - not angry, not paranoid. Cranial Nerves-Normal Bilaterally. Gait-Normal.  Neuropsychiatric Mental status exam performed with findings of-able to articulate well with normal speech/language, rate, volume and coherence, thought content normal with ability to perform basic computations and apply abstract reasoning and no evidence of hallucinations, delusions, obsessions or homicidal/suicidal ideation.  Musculoskeletal Global Assessment Spine, Ribs and Pelvis - no instability, subluxation or laxity. Right Upper Extremity - no instability, subluxation or laxity.  Lymphatic Head & Neck  General Head & Neck Lymphatics: Bilateral - Description - No Localized lymphadenopathy. Axillary  General Axillary Region: Bilateral - Description - No Localized lymphadenopathy. Femoral & Inguinal  Generalized Femoral & Inguinal Lymphatics: Left - Description - No Localized lymphadenopathy. Right - Description - No Localized lymphadenopathy.    Assessment & Plan Jessica Sportsman MD; 12/07/2016 3:36 PM)  CROHN'S DISEASE OF RECTUM WITH FISTULA (K50.113) Impression: Recurrent perirectal abscess strongly suspicious of fistula by history and physical exam.  I agree with recommendation of examination under anesthesia with better drainage and probable seton placement. Once risk of recurrent infection has abated, gastroenterology can consider reevaluation of need for Remicade versus other intervention to help control Crohn's disease. Trying hold off on any lift repair anything too aggressive at this time to minimize scarring and sphincter compromise  Current Plans Pt Education - [facility] Abscess/Fistula (AT): discussed with patient and provided information. The anatomy &  physiology of the anorectal region was discussed. We discussed the pathophysiology of anorectal abscess and fistula. Differential diagnosis was discussed. Natural history progression was discussed. I stressed the importance of a bowel regimen to have daily soft bowel movements to minimize progression of disease.  The patient's condition is not adequately controlled. Non-operative treatment has not healed the fistula. Therefore, I recommended examination  under anaesthesia to confirm the diagnosis and treat the fistula. I discussed techniques that may be required such as fistulotomy, ligation by LIFT technique, and/or seton placement. Benefits & alternatives discussed. I noted a good likelihood this will help address the problem, but sometimes repeat operations and prolonged healing times may occur. Risks such as bleeding, pain, recurrence, reoperation, incontinence, heart attack, death, and other risks were discussed.  Educational handouts further explaining the pathology, treatment options, and bowel regimen were given. The patient expressed understanding & wishes to proceed. We will work to coordinate surgery for a mutually convenient time.  ENCOUNTER FOR PREOPERATIVE EXAMINATION FOR GENERAL SURGICAL PROCEDURE (Z01.818)  Current Plans You are being scheduled for surgery- Our schedulers will call you.  You should hear from our office's scheduling department within 5 working days about the location, date, and time of surgery. We try to make accommodations for patient's preferences in scheduling surgery, but sometimes the OR schedule or the surgeon's schedule prevents [place] from making those accommodations.  If you have not heard from our office [phone]) in 5 working days, call the office and ask for your surgeon's nurse.  If you have other questions about your diagnosis, plan, or surgery, call the office and ask for your surgeon's nurse.  Pt Education - [facility] Rectal Prep for  Anorectal outpatient/office surgery: discussed with patient and provided information. Pt Education - [facility] Rectal Surgery HCI (Terald Jump): discussed with patient and provided information. Pt Education - [facility] Good Bowel Health (Galilee Pierron)  Jessica [name], M.D., F.A.C.S. Gastrointestinal and Minimally Invasive Surgery [facility] [address] [place], [place] [address] [phone] Main / Paging

## 2017-01-08 ENCOUNTER — Other Ambulatory Visit: Payer: Self-pay | Admitting: Surgery
